# Patient Record
Sex: Female | Born: 1985 | Race: White | Hispanic: No | Smoking: Current every day smoker
Health system: Southern US, Community
[De-identification: ages and names within clinical notes are randomized; demographics above are authoritative.]

---

## 2005-11-18 ENCOUNTER — Emergency Department: Payer: Self-pay | Admitting: Emergency Medicine

## 2007-04-10 ENCOUNTER — Emergency Department: Payer: Self-pay | Admitting: Emergency Medicine

## 2007-04-12 ENCOUNTER — Ambulatory Visit: Payer: Self-pay | Admitting: Emergency Medicine

## 2007-10-20 ENCOUNTER — Observation Stay: Payer: Self-pay | Admitting: Obstetrics and Gynecology

## 2007-11-22 ENCOUNTER — Observation Stay: Payer: Self-pay

## 2008-01-22 ENCOUNTER — Emergency Department: Payer: Self-pay | Admitting: Internal Medicine

## 2008-10-06 ENCOUNTER — Ambulatory Visit: Payer: Self-pay | Admitting: Neurology

## 2008-12-15 ENCOUNTER — Observation Stay: Payer: Self-pay

## 2008-12-30 ENCOUNTER — Observation Stay: Payer: Self-pay | Admitting: Obstetrics and Gynecology

## 2009-02-05 ENCOUNTER — Inpatient Hospital Stay: Payer: Self-pay | Admitting: Obstetrics and Gynecology

## 2010-02-07 ENCOUNTER — Emergency Department: Payer: Self-pay | Admitting: Emergency Medicine

## 2010-07-06 ENCOUNTER — Encounter: Payer: Self-pay | Admitting: Obstetrics and Gynecology

## 2010-08-21 ENCOUNTER — Encounter: Payer: Self-pay | Admitting: Obstetrics and Gynecology

## 2010-10-05 ENCOUNTER — Observation Stay: Payer: Self-pay | Admitting: Obstetrics and Gynecology

## 2010-11-02 ENCOUNTER — Ambulatory Visit: Payer: Self-pay | Admitting: Obstetrics and Gynecology

## 2010-11-03 ENCOUNTER — Inpatient Hospital Stay: Payer: Self-pay | Admitting: Obstetrics and Gynecology

## 2013-12-27 ENCOUNTER — Emergency Department: Payer: Self-pay | Admitting: Emergency Medicine

## 2014-08-04 ENCOUNTER — Emergency Department: Payer: Self-pay | Admitting: Emergency Medicine

## 2014-08-11 ENCOUNTER — Ambulatory Visit: Payer: Self-pay | Admitting: Podiatry
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
## 2015-05-27 ENCOUNTER — Emergency Department
Admission: EM | Admit: 2015-05-27 | Discharge: 2015-05-28 | Disposition: A | Discharge status: Home / Self Care | Payer: Self-pay

## 2019-11-12 ENCOUNTER — Other Ambulatory Visit: Payer: Self-pay

## 2019-11-12 ENCOUNTER — Other Ambulatory Visit: Payer: Self-pay | Admitting: Emergency Medicine

## 2019-11-12 ENCOUNTER — Emergency Department: Payer: Self-pay

## 2019-11-12 ENCOUNTER — Emergency Department
Admission: EM | Admit: 2019-11-12 | Discharge: 2019-11-12 | Disposition: A | Discharge status: Home / Self Care | Payer: Self-pay | Attending: Emergency Medicine | Admitting: Emergency Medicine

## 2019-11-12 DIAGNOSIS — K8050 Calculus of bile duct without cholangitis or cholecystitis without obstruction: Secondary | ICD-10-CM | POA: Insufficient documentation

## 2019-11-12 DIAGNOSIS — R1110 Vomiting, unspecified: Secondary | ICD-10-CM | POA: Insufficient documentation

## 2019-11-12 DIAGNOSIS — R1011 Right upper quadrant pain: Secondary | ICD-10-CM | POA: Insufficient documentation

## 2019-11-12 LAB — POCT PREGNANCY, URINE: Preg Test, Ur: NEGATIVE

## 2019-11-12 LAB — POC URINE PREG, ED: Preg Test, Ur: NEGATIVE

## 2019-11-12 MED ORDER — ONDANSETRON 4 MG PO TBDP: 4.0000 mg | ORAL_TABLET | Freq: Three times a day (TID) | ORAL | 0 refills | Status: DC | PRN

## 2019-11-12 MED ORDER — OXYCODONE HCL 5 MG PO TABS: 5.0000 mg | ORAL_TABLET | Freq: Three times a day (TID) | ORAL | 0 refills | Status: AC | PRN

## 2019-11-12 NOTE — Discharge Instructions (Signed)
Call the above number and said that you are seen in the emergency room and that you need follow-up as soon as possible for a gallbladder issue.  If you develop the pain again take the oxycodone and nausea medicine to help.  However if the pain is becoming more severe than today or you develop fevers, worsening vomiting or any other concerns you should return to have your liver function retested to ensure that there is no signs of obstruction or infection.

## 2019-11-12 NOTE — ED Notes (Addendum)
This RN introduced self to pt. Pt c/o upper abdominal pain x2days that radiates to the back. Pt states pain is worse after she eats and has awaken her in the middle of the night. Pt pain accompanied by N/V. Pt states pain is 6/10 currently. Pt family at bedside.

## 2019-11-12 NOTE — ED Triage Notes (Signed)
See paper chart for computer downtime 

## 2019-11-12 NOTE — ED Notes (Addendum)
Labs were collected for this pt during downtime. Annie Main, RN contacted lab to fax results. Faxed results given to MD.

## 2019-11-12 NOTE — ED Provider Notes (Signed)
[facility] Emergency Department Provider Note  ____________________________________________   First MD Initiated Contact with Patient 11/12/19 [phone]     (approximate)  I have reviewed the triage vital signs and the nursing notes.   HISTORY  Chief Complaint Abdominal Pain    HPI Beverly Acosta is a 33 y.o. female otherwise healthy comes in with midepigastric pain x2 days that radiates into her back.  The pain has been intermittent for 2 days, worse after eating occasionally happens in the middle the night, severe, radiates into her back, better after few hours on its own.  Denies ever having this previously. Associated with episode of NBNB vomiting x1. Prior C-section and tubal ligation but no other surgeries.  Denies any other medical history.  Does smoke and drinks a lot of coffee.  Denies alcohol or NSAIDs      Medical: None Surgical: C-section Allergies Patient has no allergy information on record.  No family history on file.  Social History   Review of Systems Constitutional: No fever/chills Eyes: No visual changes. ENT: No sore throat. Cardiovascular: Denies chest pain. Respiratory: Denies shortness of breath. Gastrointestinal: + abd pain. + vomiting.  No diarrhea.  No constipation. Genitourinary: Negative for dysuria. Musculoskeletal: Negative for back pain. Skin: Negative for rash. Neurological: Negative for headaches, focal weakness or numbness. All other ROS negative ____________________________________________   PHYSICAL EXAM:  VITAL SIGNS: Blood pressure (!) 133/52, pulse 62, temperature 98.8 F (37.1 C), temperature source Oral, resp. rate 16, height 5\' 4"  (1.626 m), weight 92.5 kg, SpO2 99 %.   Constitutional: Alert and oriented. Well appearing and in no acute distress. Eyes: Conjunctivae are normal. EOMI. Head: Atraumatic. Nose: No congestion/rhinnorhea. Mouth/Throat: Mucous membranes are moist.   Neck: No stridor.  Trachea Midline. FROM Cardiovascular: Normal rate, regular rhythm. Grossly normal heart sounds.  Good peripheral circulation. Respiratory: Normal respiratory effort.  No retractions. Lungs CTAB. Gastrointestinal: Soft and nontender. No distention. No abdominal bruits.  Musculoskeletal: No lower extremity tenderness nor edema.  No joint effusions. Neurologic:  Normal speech and language. No gross focal neurologic deficits are appreciated.  Skin:  Skin is warm, dry and intact. No rash noted. Psychiatric: Mood and affect are normal. Speech and behavior are normal. GU: Deferred   ____________________________________________   LABS (all labs ordered are listed, but only abnormal results are displayed)  Labs Reviewed  CBC WITH DIFFERENTIAL/PLATELET  COMPREHENSIVE METABOLIC PANEL  LIPASE, BLOOD  URINALYSIS, ROUTINE W REFLEX MICROSCOPIC  POC URINE PREG, ED   ____________________________________________   ED ECG REPORT I, Vanessa Peck, the attending physician, personally viewed and interpreted this ECG.  EKG is sinus bradycardia rate of 54, no ST elevation, T wave inversion in lead III, normal intervals ____________________________________________  RADIOLOGY  Official radiology report(s): US ABDOMEN LIMITED RUQ  Result Date: 11/12/2019 CLINICAL DATA:  Right upper quadrant pain for 2 days EXAM: ULTRASOUND ABDOMEN LIMITED RIGHT UPPER QUADRANT COMPARISON:  None. FINDINGS: Gallbladder: Shadowing gallstones including stone fixed at the gallbladder neck. Calculi are measured at up to 8 mm. Minimal pericholecystic fluid without gallbladder wall thickening or focal tenderness. Common bile duct: Diameter: 4 mm Liver: No focal lesion identified. Within normal limits in parenchymal echogenicity. Portal vein is patent on color Doppler imaging with normal direction of blood flow towards the liver. IMPRESSION: 1. Cholelithiasis including stone fixed at the gallbladder neck. 2. Trace pericholecystic  fluid but no wall thickening or focal tenderness typical of acute cholecystitis. Electronically Signed   By: Angelica Chessman  Watts M.D.   On: 11/12/2019 05:42    ____________________________________________   PROCEDURES  Procedure(s) performed (including Critical Care):  Procedures   ____________________________________________   INITIAL IMPRESSION / ASSESSMENT AND PLAN / ED COURSE  Beverly Acosta was evaluated in Emergency Department on 11/12/2019 for the symptoms described in the history of present illness. She was evaluated in the context of the global COVID-19 pandemic, which necessitated consideration that the patient might be at risk for infection with the SARS-CoV-2 virus that causes COVID-19. Institutional protocols and algorithms that pertain to the evaluation of patients at risk for COVID-19 are in a state of rapid change based on information released by regulatory bodies including the CDC and federal and state organizations. These policies and algorithms were followed during the patient's care in the ED.    Patient is well-appearing 33 year old who presents with epigastric abdominal pain.  Will get labs to evaluate for cholecystitis, pancreatitis, ACS.  Also get ultrasound to evaluate for biliary colic versus cholecystitis.  Patient's labs are reassuring.  They were done during downtime so was given a printed fax.  Her CMP was normal except for a slightly elevated glucose of 108.  Her CBC was normal except for a white count of 11.5.  And her troponin was less than 2 and her urine had 6-10 RBCs but no WBCs and was otherwise negative.  Lipase was also negative.   Ultrasound was consistent with cholelithiasis with a stone fixed at the gallbladder neck.  There is trace.  "Cholecystic fluid but no wall thickening or focal tenderness.  I reevaluated patient and she continues to be nontender and asymptomatic.  I had a lengthy discussion with patient that she would need close follow-up with  surgery and that there is a chance that this could cause obstruction or infection.  I did message Dr. Maia Plan who is on-call to help ensure early follow-up.  Patient also given the number to call later this afternoon to get scheduled an appointment.  In the meantime I will give her a few oxycodone and nausea medicine to help with the biliary colic but we discussed avoiding fatty foods.  We also discussed not using the medicine to mask the pain because it is becoming worse then earlier today or she develops fevers, worsening vomiting that she would need to be reevaluated make sure that the stone is not causing obstruction or infection.  Patient did express understanding and felt comfortable with going home given she is completely asymptomatic at this time with normal labs.  I discussed the provisional nature of ED diagnosis, the treatment so far, the ongoing plan of care, follow up appointments and return precautions with the patient and any family or support people present. They expressed understanding and agreed with the plan, discharged home.  ____________________________________________   FINAL CLINICAL IMPRESSION(S) / ED DIAGNOSES   Final diagnoses:  RUQ pain  Biliary colic      MEDICATIONS GIVEN DURING THIS VISIT:  Medications - No data to display   ED Discharge Orders         Ordered    ondansetron (ZOFRAN ODT) 4 MG disintegrating tablet  Every 8 hours PRN     11/12/19 0620    oxyCODONE (ROXICODONE) 5 MG immediate release tablet  Every 8 hours PRN     11/12/19 0620           Note:  This document was prepared using Dragon voice recognition software and may include unintentional dictation errors.   Artis Delay  E, MD 11/12/19 [phone]

## 2019-11-12 NOTE — ED Provider Notes (Signed)
Patient was seen earlier and given a prescription for oxycodone 5 mg 3 times daily #15 as needed pain but Dr. Jari Pigg did not sign the prescription so the pharmacy would not fill it.  Patient brought the prescription back and I have given her a handwritten 1 under my name for the same drug and amount.   Nena Polio, MD 11/12/19 [phone]

## 2020-03-01 IMAGING — US US ABDOMEN LIMITED
1 series · 14 of 25 positions shown · non-contrast
Comparison: None.

CLINICAL DATA: Right upper quadrant pain for 2 days

EXAM:
ULTRASOUND ABDOMEN LIMITED RIGHT UPPER QUADRANT

[Series 1: us abdomen limited · 14 of 79 slices shown]
[im 1/79]
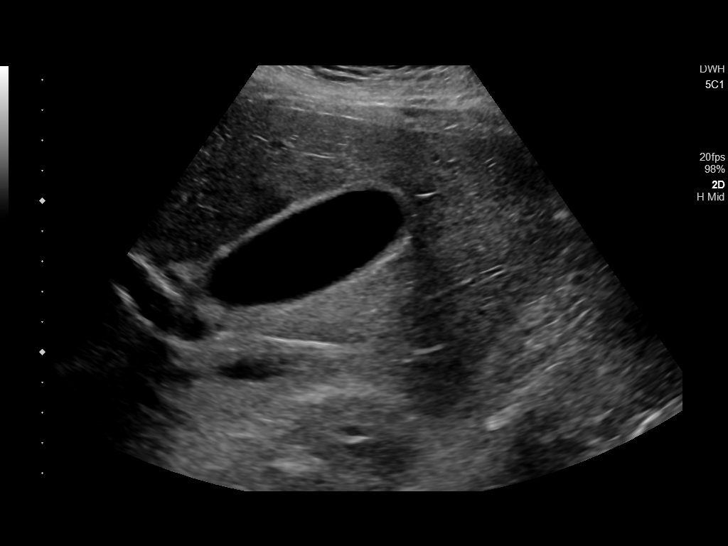
[im 7/79]
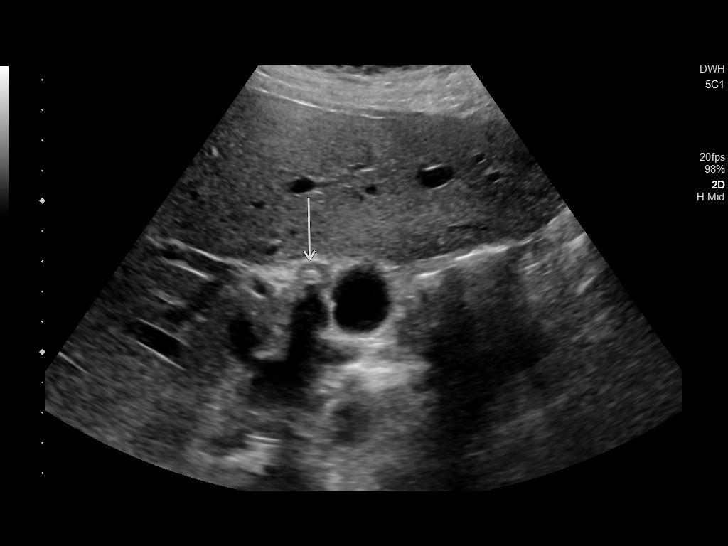
[im 14/79]
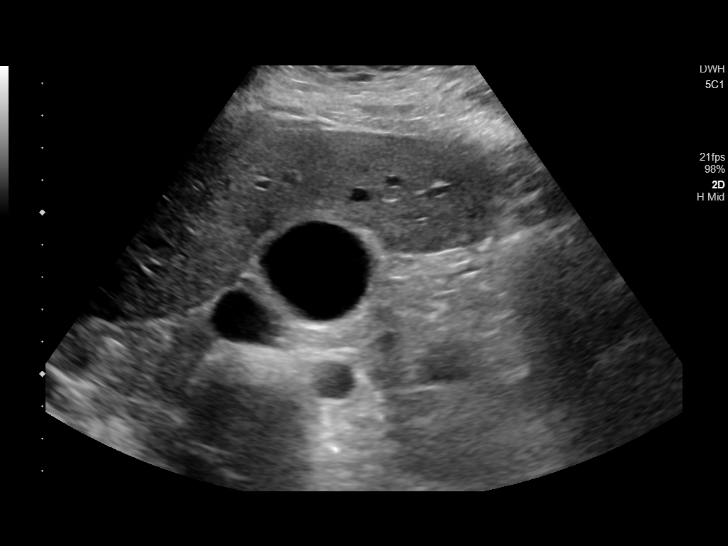
[im 20/79]
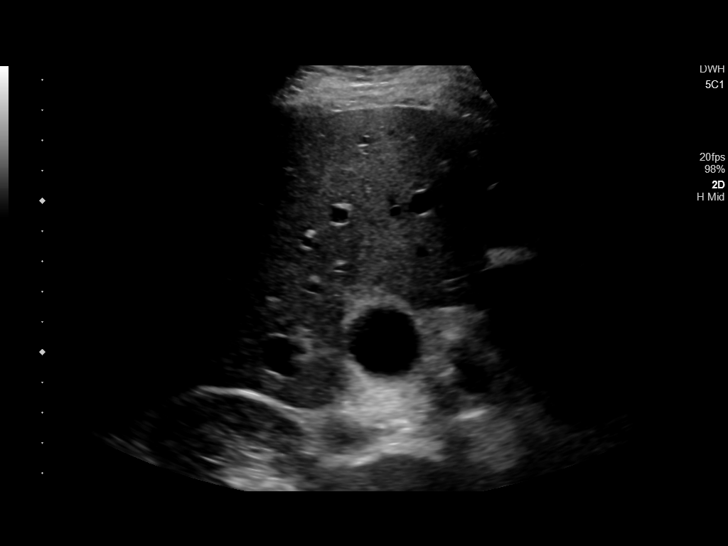
[im 27/79]
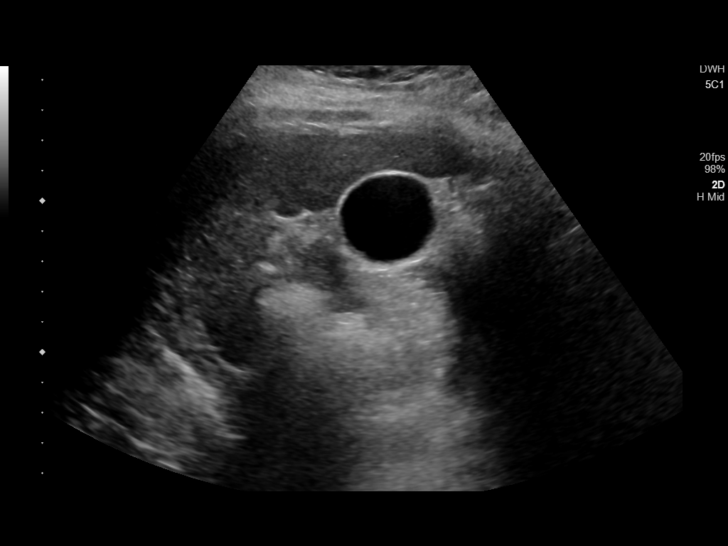
[im 30/79]
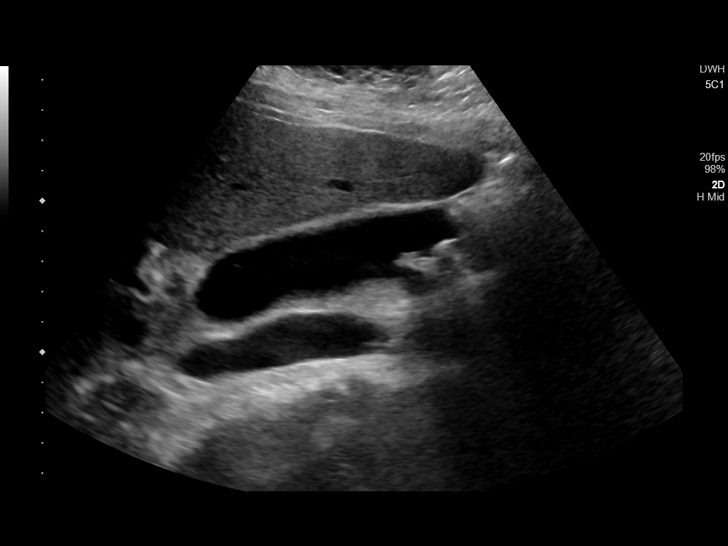
[im 36/79]
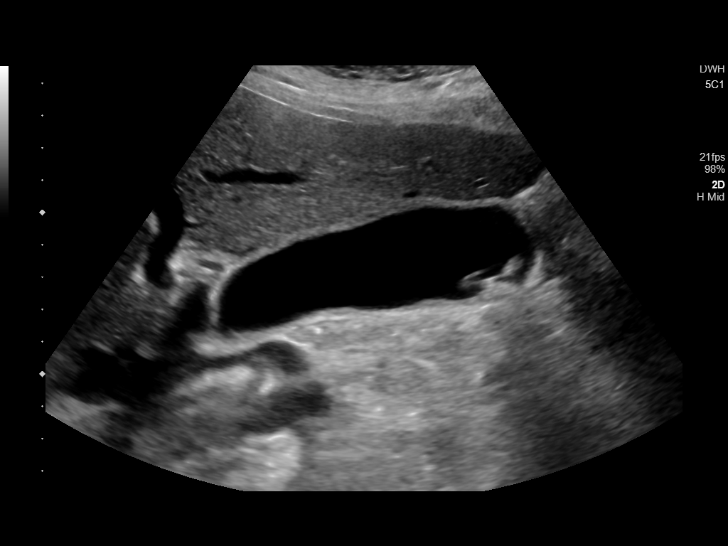
[im 43/79]
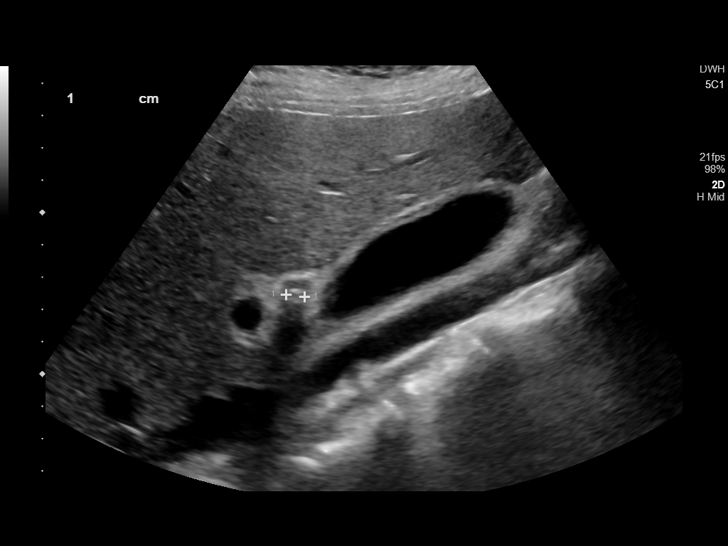
[im 49/79]
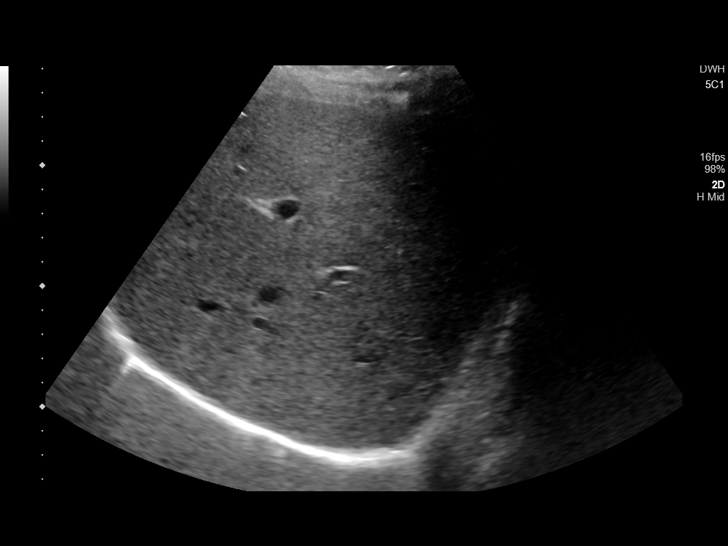
[im 53/79]
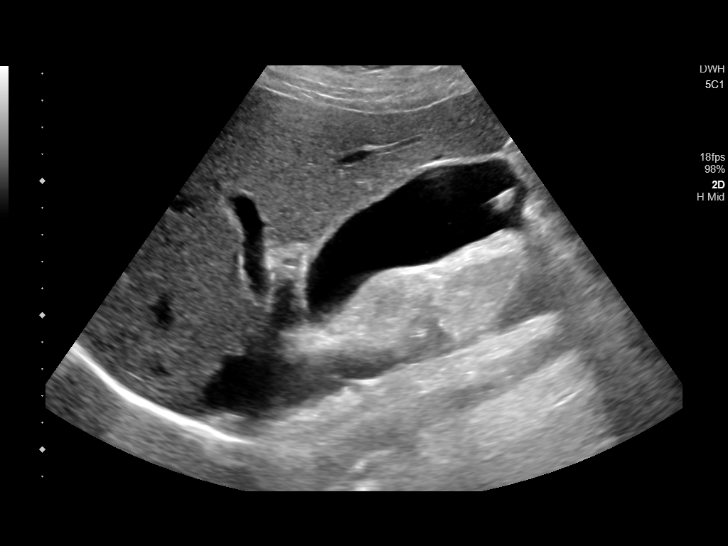
[im 59/79]
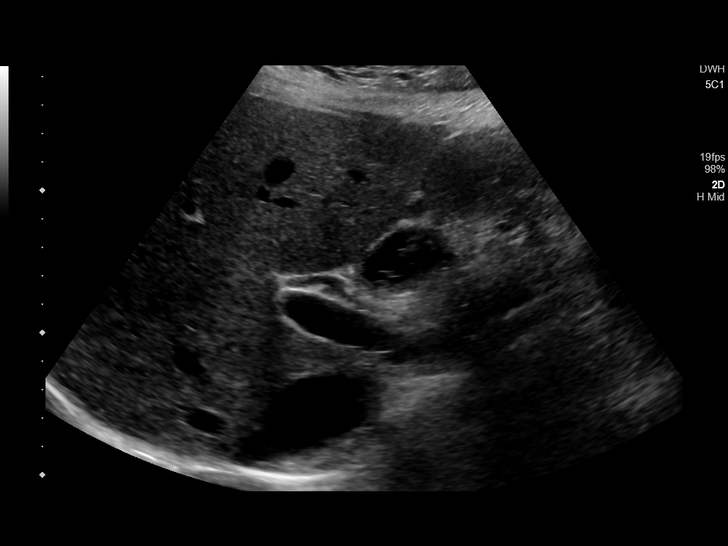
[im 66/79]
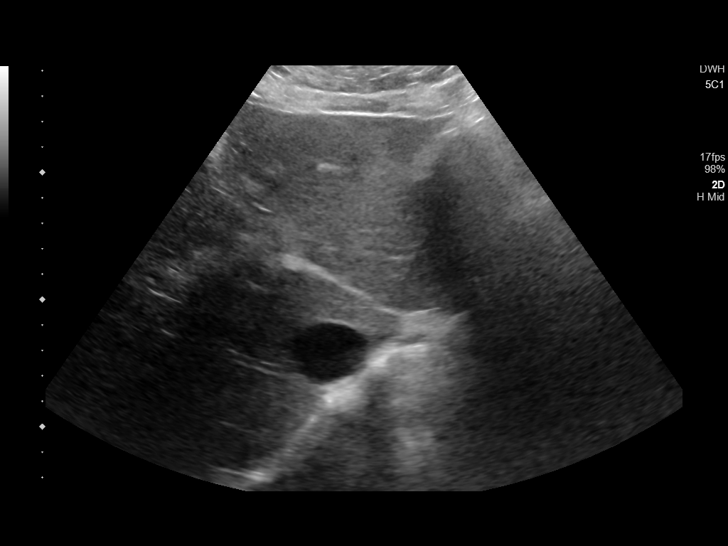
[im 72/79]
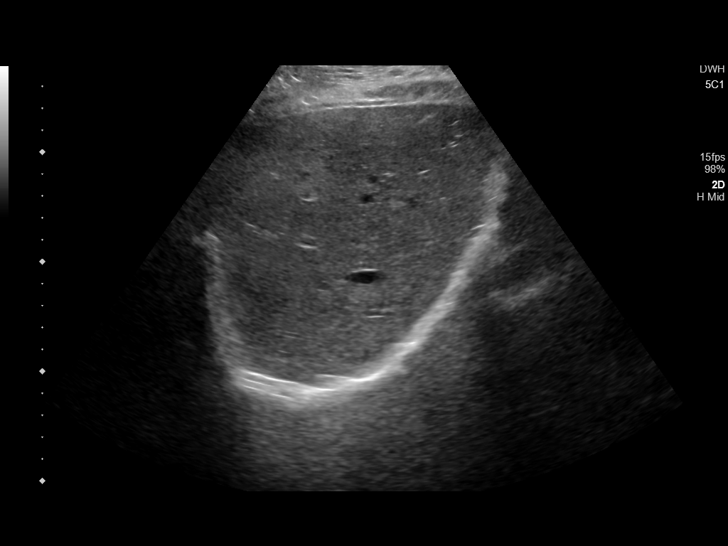
[im 79/79]
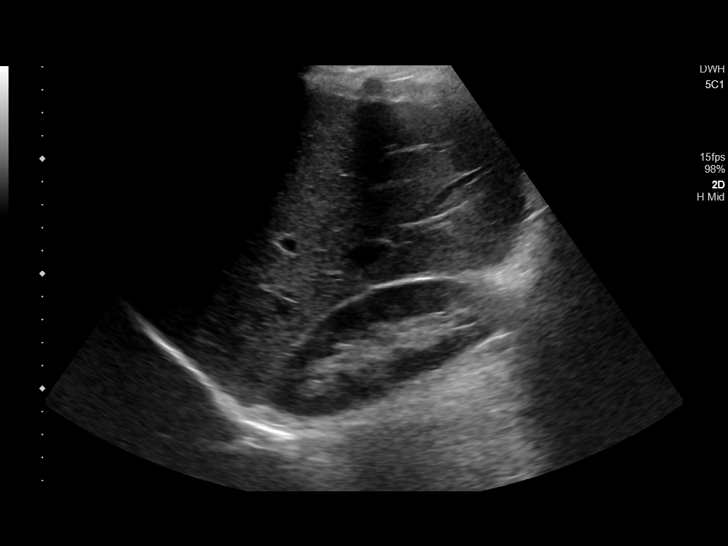

[14 of 25 positions shown; findings below may reference images not displayed]

FINDINGS: Gallbladder:

Shadowing gallstones including stone fixed at the gallbladder neck.
Calculi are measured at up to 8 mm. Minimal pericholecystic fluid
without gallbladder wall thickening or focal tenderness.

Common bile duct:

Diameter: 4 mm

Liver:

No focal lesion identified. Within normal limits in parenchymal
echogenicity. Portal vein is patent on color Doppler imaging with
normal direction of blood flow towards the liver.
IMPRESSION: 1. Cholelithiasis including stone fixed at the gallbladder neck.
2. Trace pericholecystic fluid but no wall thickening or focal
tenderness typical of acute cholecystitis.

## 2020-08-10 ENCOUNTER — Telehealth: Payer: Self-pay | Admitting: Physician Assistant

## 2020-08-10 NOTE — Telephone Encounter (Signed)
Called to discuss with patient about Covid symptoms and the use of casirivimab/imdevimab, a monoclonal antibody infusion for those with mild to moderate Covid symptoms and at a high risk of hospitalization.  Pt is qualified for this infusion at the [facility] infusion center due to; Specific high risk criteria : BMI > 25. Pt called bc her brother had the infusion.    Message left to call back our hotline [phone].  Cline Crock PA-C  [facility]

## 2023-07-30 ENCOUNTER — Other Ambulatory Visit: Payer: Self-pay

## 2023-07-30 ENCOUNTER — Emergency Department (HOSPITAL_COMMUNITY): Payer: Medicaid Other

## 2023-07-30 ENCOUNTER — Encounter (HOSPITAL_COMMUNITY): Payer: Self-pay | Admitting: *Deleted

## 2023-07-30 ENCOUNTER — Emergency Department (HOSPITAL_COMMUNITY)
Admission: EM | Admit: 2023-07-30 | Discharge: 2023-07-30 | Disposition: A | Discharge status: Home / Self Care | Payer: Medicaid Other | Attending: Emergency Medicine | Admitting: Emergency Medicine

## 2023-07-30 DIAGNOSIS — R1011 Right upper quadrant pain: Secondary | ICD-10-CM | POA: Diagnosis present

## 2023-07-30 DIAGNOSIS — K8020 Calculus of gallbladder without cholecystitis without obstruction: Secondary | ICD-10-CM | POA: Diagnosis not present

## 2023-07-30 LAB — CBC
HCT: 43.6 % (ref 36.0–46.0)
Hemoglobin: 14.7 g/dL (ref 12.0–15.0)
MCH: 30.7 pg (ref 26.0–34.0)
MCHC: 33.7 g/dL (ref 30.0–36.0)
MCV: 91 fL (ref 80.0–100.0)
Platelets: 257 10*3/uL (ref 150–400)
RBC: 4.79 MIL/uL (ref 3.87–5.11)
RDW: 12.6 % (ref 11.5–15.5)
WBC: 9.1 10*3/uL (ref 4.0–10.5)
nRBC: 0 % (ref 0.0–0.2)

## 2023-07-30 LAB — URINALYSIS, ROUTINE W REFLEX MICROSCOPIC
Bilirubin Urine: NEGATIVE
Glucose, UA: NEGATIVE mg/dL
Ketones, ur: NEGATIVE mg/dL
Leukocytes,Ua: NEGATIVE
Nitrite: NEGATIVE
Protein, ur: NEGATIVE mg/dL
Specific Gravity, Urine: 1.021 (ref 1.005–1.030)
pH: 5 (ref 5.0–8.0)

## 2023-07-30 LAB — LIPASE, BLOOD: Lipase: 29 U/L (ref 11–51)

## 2023-07-30 LAB — COMPREHENSIVE METABOLIC PANEL
ALT: 27 U/L (ref 0–44)
AST: 16 U/L (ref 15–41)
Albumin: 3.7 g/dL (ref 3.5–5.0)
Alkaline Phosphatase: 45 U/L (ref 38–126)
Anion gap: 10 (ref 5–15)
BUN: 16 mg/dL (ref 6–20)
CO2: 23 mmol/L (ref 22–32)
Calcium: 9 mg/dL (ref 8.9–10.3)
Chloride: 103 mmol/L (ref 98–111)
Creatinine, Ser: 0.89 mg/dL (ref 0.44–1.00)
GFR, Estimated: >60 mL/min (ref >60)
Glucose, Bld: 108 mg/dL — ABNORMAL HIGH (ref 70–99)
Potassium: 3.5 mmol/L (ref 3.5–5.1)
Sodium: 136 mmol/L (ref 135–145)
Total Bilirubin: 0.5 mg/dL (ref 0.3–1.2)
Total Protein: 6.5 g/dL (ref 6.5–8.1)

## 2023-07-30 LAB — HCG, SERUM, QUALITATIVE: Preg, Serum: NEGATIVE

## 2023-07-30 MED ORDER — ONDANSETRON 4 MG PO TBDP: 4.0000 mg | ORAL_TABLET | Freq: Three times a day (TID) | ORAL | 0 refills | Status: AC | PRN

## 2023-07-30 NOTE — ED Notes (Signed)
US at bedside

## 2023-07-30 NOTE — Discharge Instructions (Signed)
You have been evaluated for your symptoms.  You do have evidence of gallstone.  Please avoid eating fatty food, take Zofran as needed for nausea, call and follow-up closely with general surgery for outpatient management of your condition.  If your symptoms become progressively worse and you have any concern please return to the ED for further care.

## 2023-07-30 NOTE — ED Triage Notes (Signed)
Pain under the right breast that radiates to the back, feels like pain from her gallbladder, had issues about a year ago for the same. Vomited x 2. Pain in the central chest. No meds PTA.

## 2023-07-30 NOTE — ED Provider Notes (Signed)
[place] EMERGENCY DEPARTMENT AT [facility] Provider Note   CSN: [id number] Arrival date & time: 07/30/23  0436     History  Chief Complaint  Patient presents with   Abdominal Pain    Beverly Acosta is a 37 y.o. female.  The history is provided by the patient and medical records. No language interpreter was used.  Abdominal Pain    37 year old female presenting with complaint of abdominal pain.  Patient endorsed having pain to her right upper quadrant since yesterday afternoon.  She described pain as a cramping sensation, radiates towards her back with associated nausea and 1 episodes of nonbloody nonbilious vomiting.  This morning she felt the pain came back which concerns her.  States that the symptoms seems to be improving.  Mention that she has history of gallstones in the past and pain felt similar.  She has not had her gallbladder removed.  She ate mac & cheese, and [employer] last night.  She denies any fever or chills no chest pain or shortness of breath no productive cough no urinary symptoms no vaginal bleeding or vaginal discharge.  She denies heavy alcohol use or tobacco use.  Home Medications Prior to Admission medications   Medication Sig Start Date End Date Taking? Authorizing Provider  ondansetron (ZOFRAN ODT) 4 MG disintegrating tablet Take 1 tablet (4 mg total) by mouth every 8 (eight) hours as needed for nausea or vomiting. 11/12/19   Concha Se, MD      Allergies    Sulfamethoxazole-trimethoprim    Review of Systems   Review of Systems  Gastrointestinal:  Positive for abdominal pain.  All other systems reviewed and are negative.   Physical Exam Updated Vital Signs BP (!) 144/97 (BP Location: Left Arm)   Pulse 69   Temp 98.9 F (37.2 C) (Oral)   Resp 18   LMP 07/12/2023   SpO2 96%  Physical Exam Vitals and nursing note reviewed.  Constitutional:      General: She is not in acute distress.    Appearance: She is well-developed.   HENT:     Head: Atraumatic.  Eyes:     Conjunctiva/sclera: Conjunctivae normal.  Cardiovascular:     Rate and Rhythm: Normal rate and regular rhythm.  Pulmonary:     Effort: Pulmonary effort is normal.     Breath sounds: No wheezing, rhonchi or rales.  Abdominal:     Tenderness: There is abdominal tenderness in the right upper quadrant. There is no guarding or rebound. Negative signs include Murphy's sign, Rovsing's sign and McBurney's sign.     Hernia: No hernia is present.  Musculoskeletal:     Cervical back: Neck supple.  Skin:    Findings: No rash.  Neurological:     Mental Status: She is alert.  Psychiatric:        Mood and Affect: Mood normal.     ED Results / Procedures / Treatments   Labs (all labs ordered are listed, but only abnormal results are displayed) Labs Reviewed  COMPREHENSIVE METABOLIC PANEL - Abnormal; Notable for the following components:      Result Value   Glucose, Bld 108 (*)    All other components within normal limits  URINALYSIS, ROUTINE W REFLEX MICROSCOPIC - Abnormal; Notable for the following components:   APPearance HAZY (*)    Hgb urine dipstick SMALL (*)    Bacteria, UA FEW (*)    All other components within normal limits  LIPASE, BLOOD  CBC  HCG, SERUM, QUALITATIVE    EKG None  Radiology US Abdomen Limited  Result Date: 07/30/2023 CLINICAL DATA:  Right upper quadrant pain EXAM: ULTRASOUND ABDOMEN LIMITED RIGHT UPPER QUADRANT COMPARISON:  Ultrasound 11/12/2019 FINDINGS: Gallbladder: Gallbladder is nondilated. There is some wall thickening but nonspecific with this level distention of up to 4 mm. Multiple shadowing stones in the gallbladder. No adjacent fluid. Common bile duct: Diameter: 3 mm Liver: No focal lesion identified. Within normal limits in parenchymal echogenicity. Portal vein is patent on color Doppler imaging with normal direction of blood flow towards the liver. Other: None. IMPRESSION: Gallstones. Slight gallbladder wall  thickening but the gallbladder is nondilated. No ductal dilatation. No adjacent fluid or reported sonographic Murphy's sign. Electronically Signed   By: Karen Kays M.D.   On: 07/30/2023 12:43    Procedures Procedures    Medications Ordered in ED Medications - No data to display  ED Course/ Medical Decision Making/ A&P                                 Medical Decision Making Amount and/or Complexity of Data Reviewed Labs: ordered. Radiology: ordered.   BP 129/70   Pulse 73   Temp 98.4 F (36.9 C)   Resp 17   LMP 07/12/2023   SpO2 99%   44:31 AM  37 year old female presenting with complaint of abdominal pain.  Patient endorsed having pain to her right upper quadrant since yesterday afternoon.  She described pain as a cramping sensation, radiates towards her back with associated nausea and 1 episodes of nonbloody nonbilious vomiting.  This morning she felt the pain came back which concerns her.  States that the symptoms seems to be improving.  Mention that she has history of gallstones in the past and pain felt similar.  She has not had her gallbladder removed.  She ate mac & cheese, and [employer] last night.  She denies any fever or chills no chest pain or shortness of breath no productive cough no urinary symptoms no vaginal bleeding or vaginal discharge.  She denies heavy alcohol use or tobacco use.  On exam this is a well-appearing female resting comfortably in bed appears to be in no acute discomfort.  Heart with normal rate and rhythm, lungs clear to auscultation bilaterally abdomen is soft with some right upper quadrant tenderness but without guarding or rebound tenderness.  Negative Murphy sign, no pain McBurney's point  Vital signs overall reassuring no fever no hypoxia.  Given symptoms similar to prior gallstone, will obtain limited abdominal ultrasound to assess for any biliary disease.  -Labs ordered, independently viewed and interpreted by me.  Labs remarkable for normal  UA, labs are reassuring, normal lipase and normal liver function -The patient was maintained on a cardiac monitor.  I personally viewed and interpreted the cardiac monitored which showed an underlying rhythm of: NSR -Imaging independently viewed and interpreted by me and I agree with radiologist's interpretation.  Result remarkable for limited abd US showing gallstones with slight gallbladder wall thickening but the gallbladder is nondilated.   -This patient presents to the ED for concern of abd pain, this involves an extensive number of treatment options, and is a complaint that carries with it a high risk of complications and morbidity.  The differential diagnosis includes cholecystitis, colitis, gastritis, appendicitis, GERD, PUD -Co morbidities that complicate the patient evaluation includes none -Treatment includes none -Reevaluation of the patient  after these medicines showed that the patient improved -PCP office notes or outside notes reviewed -Escalation to admission/observation considered: patients feels much better, is comfortable with discharge, and will follow up with PCP -Prescription medication considered, patient comfortable with zofran -Social Determinant of Health considered which includes tobacco use  1:46 PM Patient overall well-appearing.  Pain is well-controlled.  At this time I felt patient is stable to be discharged home with close follow-up outpatient with [facility] surgery for her gallbladder problem.  I encouraged patient to avoid fatty food in the meantime and I gave patient's strict return precaution.  Patient was understanding and agrees with plan.         Final Clinical Impression(s) / ED Diagnoses Final diagnoses:  Calculus of gallbladder without cholecystitis without obstruction    Rx / DC Orders ED Discharge Orders          Ordered    ondansetron (ZOFRAN ODT) 4 MG disintegrating tablet  Every 8 hours PRN        07/30/23 1347               Fayrene Helper, PA-C 07/30/23 1349    Bethann Berkshire, MD 07/31/23 1137

## 2024-03-31 ENCOUNTER — Other Ambulatory Visit: Payer: Self-pay

## 2024-03-31 ENCOUNTER — Emergency Department (HOSPITAL_COMMUNITY)
Admission: EM | Admit: 2024-03-31 | Discharge: 2024-03-31 | Discharge status: Against Medical Advice | Attending: Emergency Medicine | Admitting: Emergency Medicine

## 2024-03-31 ENCOUNTER — Encounter (HOSPITAL_COMMUNITY): Payer: Self-pay

## 2024-03-31 DIAGNOSIS — Z5321 Procedure and treatment not carried out due to patient leaving prior to being seen by health care provider: Secondary | ICD-10-CM | POA: Diagnosis not present

## 2024-03-31 DIAGNOSIS — R110 Nausea: Secondary | ICD-10-CM | POA: Insufficient documentation

## 2024-03-31 DIAGNOSIS — R1013 Epigastric pain: Secondary | ICD-10-CM | POA: Insufficient documentation

## 2024-03-31 LAB — CBC
HCT: 45.6 % (ref 36.0–46.0)
Hemoglobin: 14.6 g/dL (ref 12.0–15.0)
MCH: 29.1 pg (ref 26.0–34.0)
MCHC: 32 g/dL (ref 30.0–36.0)
MCV: 91 fL (ref 80.0–100.0)
Platelets: 303 10*3/uL (ref 150–400)
RBC: 5.01 MIL/uL (ref 3.87–5.11)
RDW: 13.2 % (ref 11.5–15.5)
WBC: 9.4 10*3/uL (ref 4.0–10.5)
nRBC: 0 % (ref 0.0–0.2)

## 2024-03-31 LAB — COMPREHENSIVE METABOLIC PANEL WITH GFR
ALT: 15 U/L (ref 0–44)
AST: 13 U/L — ABNORMAL LOW (ref 15–41)
Albumin: 3.9 g/dL (ref 3.5–5.0)
Alkaline Phosphatase: 56 U/L (ref 38–126)
Anion gap: 10 (ref 5–15)
BUN: 13 mg/dL (ref 6–20)
CO2: 20 mmol/L — ABNORMAL LOW (ref 22–32)
Calcium: 8.9 mg/dL (ref 8.9–10.3)
Chloride: 107 mmol/L (ref 98–111)
Creatinine, Ser: 0.74 mg/dL (ref 0.44–1.00)
GFR, Estimated: >60 mL/min (ref >60)
Glucose, Bld: 101 mg/dL — ABNORMAL HIGH (ref 70–99)
Potassium: 4.2 mmol/L (ref 3.5–5.1)
Sodium: 137 mmol/L (ref 135–145)
Total Bilirubin: 0.6 mg/dL (ref 0.0–1.2)
Total Protein: 6.8 g/dL (ref 6.5–8.1)

## 2024-03-31 LAB — URINALYSIS, ROUTINE W REFLEX MICROSCOPIC
Bacteria, UA: NONE SEEN
Bilirubin Urine: NEGATIVE
Glucose, UA: NEGATIVE mg/dL
Ketones, ur: NEGATIVE mg/dL
Leukocytes,Ua: NEGATIVE
Nitrite: NEGATIVE
Protein, ur: NEGATIVE mg/dL
Specific Gravity, Urine: 1.02 (ref 1.005–1.030)
pH: 6 (ref 5.0–8.0)

## 2024-03-31 LAB — HCG, SERUM, QUALITATIVE: Preg, Serum: NEGATIVE

## 2024-03-31 LAB — LIPASE, BLOOD: Lipase: 27 U/L (ref 11–51)

## 2024-03-31 NOTE — ED Triage Notes (Signed)
 Patient presents to ER with epigastric pain and nausea, patient states she has had flare ups of pain like this before and thinks it is her gall bladder. Every flare up has worsening pain.

## 2024-06-04 ENCOUNTER — Encounter (HOSPITAL_COMMUNITY): Payer: Self-pay

## 2024-06-04 ENCOUNTER — Emergency Department (HOSPITAL_COMMUNITY)

## 2024-06-04 ENCOUNTER — Other Ambulatory Visit: Payer: Self-pay

## 2024-06-04 ENCOUNTER — Observation Stay (HOSPITAL_COMMUNITY)
Admission: EM | Admit: 2024-06-04 | Discharge: 2024-06-06 | Disposition: A | Discharge status: Home / Self Care | Attending: General Surgery | Admitting: General Surgery

## 2024-06-04 DIAGNOSIS — Z6841 Body Mass Index (BMI) 40.0 and over, adult: Secondary | ICD-10-CM | POA: Insufficient documentation

## 2024-06-04 DIAGNOSIS — K8012 Calculus of gallbladder with acute and chronic cholecystitis without obstruction: Secondary | ICD-10-CM | POA: Diagnosis not present

## 2024-06-04 DIAGNOSIS — R109 Unspecified abdominal pain: Secondary | ICD-10-CM | POA: Diagnosis present

## 2024-06-04 DIAGNOSIS — K8000 Calculus of gallbladder with acute cholecystitis without obstruction: Principal | ICD-10-CM

## 2024-06-04 DIAGNOSIS — F1090 Alcohol use, unspecified, uncomplicated: Secondary | ICD-10-CM | POA: Insufficient documentation

## 2024-06-04 DIAGNOSIS — K8010 Calculus of gallbladder with chronic cholecystitis without obstruction: Secondary | ICD-10-CM | POA: Diagnosis present

## 2024-06-04 DIAGNOSIS — K810 Acute cholecystitis: Secondary | ICD-10-CM | POA: Diagnosis present

## 2024-06-04 LAB — MAGNESIUM: Magnesium: 2 mg/dL (ref 1.7–2.4)

## 2024-06-04 LAB — COMPREHENSIVE METABOLIC PANEL WITH GFR
ALT: 14 U/L (ref 0–44)
AST: 16 U/L (ref 15–41)
Albumin: 4.2 g/dL (ref 3.5–5.0)
Alkaline Phosphatase: 58 U/L (ref 38–126)
Anion gap: 10 (ref 5–15)
BUN: 13 mg/dL (ref 6–20)
CO2: 23 mmol/L (ref 22–32)
Calcium: 9.8 mg/dL (ref 8.9–10.3)
Chloride: 104 mmol/L (ref 98–111)
Creatinine, Ser: 0.8 mg/dL (ref 0.44–1.00)
GFR, Estimated: >60 mL/min (ref >60)
Glucose, Bld: 107 mg/dL — ABNORMAL HIGH (ref 70–99)
Potassium: 3.6 mmol/L (ref 3.5–5.1)
Sodium: 137 mmol/L (ref 135–145)
Total Bilirubin: 0.8 mg/dL (ref 0.0–1.2)
Total Protein: 7.5 g/dL (ref 6.5–8.1)

## 2024-06-04 LAB — CBC
HCT: 46.9 % — ABNORMAL HIGH (ref 36.0–46.0)
Hemoglobin: 15.1 g/dL — ABNORMAL HIGH (ref 12.0–15.0)
MCH: 29.5 pg (ref 26.0–34.0)
MCHC: 32.2 g/dL (ref 30.0–36.0)
MCV: 91.6 fL (ref 80.0–100.0)
Platelets: 299 10*3/uL (ref 150–400)
RBC: 5.12 MIL/uL — ABNORMAL HIGH (ref 3.87–5.11)
RDW: 13 % (ref 11.5–15.5)
WBC: 15.9 10*3/uL — ABNORMAL HIGH (ref 4.0–10.5)
nRBC: 0 % (ref 0.0–0.2)

## 2024-06-04 LAB — HCG, SERUM, QUALITATIVE: Preg, Serum: NEGATIVE

## 2024-06-04 LAB — LIPASE, BLOOD: Lipase: 28 U/L (ref 11–51)

## 2024-06-04 LAB — TROPONIN I (HIGH SENSITIVITY)
Troponin I (High Sensitivity): <2 ng/L (ref <18)
Troponin I (High Sensitivity): <2 ng/L (ref <18)

## 2024-06-04 MED ORDER — PANTOPRAZOLE SODIUM 40 MG IV SOLR
40.0000 mg | Freq: Once | INTRAVENOUS | Status: AC
  Administered 2024-06-04: 40 mg via INTRAVENOUS
  Filled 2024-06-04: qty 10

## 2024-06-04 MED ORDER — ONDANSETRON HCL 4 MG/2ML IJ SOLN
4.0000 mg | Freq: Once | INTRAMUSCULAR | Status: AC
Start: 2024-06-04 — End: 2024-06-04
  Administered 2024-06-04: 4 mg via INTRAVENOUS
  Filled 2024-06-04: qty 2

## 2024-06-04 MED ORDER — IOHEXOL 300 MG/ML  SOLN
100.0000 mL | Freq: Once | INTRAMUSCULAR | Status: AC | PRN
  Administered 2024-06-04: 100 mL via INTRAVENOUS

## 2024-06-04 MED ORDER — FENTANYL CITRATE PF 50 MCG/ML IJ SOSY
50.0000 ug | PREFILLED_SYRINGE | Freq: Once | INTRAMUSCULAR | Status: AC
  Administered 2024-06-04: 50 ug via INTRAVENOUS
  Filled 2024-06-04: qty 1

## 2024-06-04 MED ORDER — SODIUM CHLORIDE 0.9 % IV BOLUS
1000.0000 mL | Freq: Once | INTRAVENOUS | Status: AC
  Administered 2024-06-04: 1000 mL via INTRAVENOUS

## 2024-06-04 NOTE — ED Notes (Signed)
 Patient transported to CT

## 2024-06-04 NOTE — ED Provider Notes (Signed)
 [facility] Provider Note   CSN: [id number] Arrival date & time: 06/04/24  1818     Patient presents with: Acosta Pain and Chest Pain   Beverly Acosta is a 38 y.o. female patient who presents to the emergency department today for further evaluation of right upper quadrant and epigastric Acosta pain that started around 11 AM this morning.  Patient states that she has had gallbladder issues in the past really over the last 5 years.  She was seen by general surgery in the past and they  ultimately stated they will hold off on surgery.  She states today pain started around 11 AM and has been constant since onset.  She describes some epigastric burning that radiates into the central chest.  Also has pain that radiates to the back as well.  She has been having intractable nausea and vomiting.  Denies any fever, chills, cough, shortness of breath, diarrhea.    Acosta Pain Associated symptoms: chest pain   Chest Pain Associated symptoms: Acosta pain        Prior to Admission medications   Medication Sig Start Date End Date Taking? Authorizing Provider  ondansetron  (ZOFRAN  ODT) 4 MG disintegrating tablet Take 1 tablet (4 mg total) by mouth every 8 (eight) hours as needed for nausea or vomiting. 07/30/23   Nivia Colon, PA-C    Allergies: Sulfamethoxazole-trimethoprim    Review of Systems  Cardiovascular:  Positive for chest pain.  Gastrointestinal:  Positive for Acosta pain.  All other systems reviewed and are negative.   Updated Vital Signs BP (!) 149/120   Pulse 60   Temp 98.1 F (36.7 C) (Oral)   Resp 15   Ht 5' 4 (1.626 m)   Wt 108.9 kg   SpO2 100%   BMI 41.21 kg/m   Physical Exam Vitals and nursing note reviewed.  Constitutional:      General: She is not in acute distress.    Appearance: Normal appearance.  HENT:     Head: Normocephalic and atraumatic.  Eyes:     General:        Right eye: No discharge.         Left eye: No discharge.  Cardiovascular:     Comments: Regular rate and rhythm.  S1/S2 are distinct without any evidence of murmur, rubs, or gallops.  Radial pulses are 2+ bilaterally.  Dorsalis pedis pulses are 2+ bilaterally.  No evidence of pedal edema. Pulmonary:     Comments: Clear to auscultation bilaterally.  Normal effort.  No respiratory distress.  No evidence of wheezes, rales, or rhonchi heard throughout. Acosta:     General: Abdomen is flat. Bowel sounds are normal. There is no distension.     Tenderness: There is guarding. There is no rebound.     Comments: Moderate right upper quadrant epigastric tenderness with minimal palpation.  Guarding present.  Musculoskeletal:        General: Normal range of motion.     Cervical back: Neck supple.  Skin:    General: Skin is warm and dry.     Findings: No rash.  Neurological:     General: No focal deficit present.     Mental Status: She is alert.  Psychiatric:        Mood and Affect: Mood normal.        Behavior: Behavior normal.     (all labs ordered are listed, but only abnormal results are displayed) Labs Reviewed  CBC - Abnormal; Notable for the following components:      Result Value   WBC 15.9 (*)    RBC 5.12 (*)    Hemoglobin 15.1 (*)    HCT 46.9 (*)    All other components within normal limits  COMPREHENSIVE METABOLIC PANEL WITH GFR - Abnormal; Notable for the following components:   Glucose, Bld 107 (*)    All other components within normal limits  LIPASE, BLOOD  MAGNESIUM  HCG, SERUM, QUALITATIVE  URINALYSIS, ROUTINE W REFLEX MICROSCOPIC  TROPONIN I (HIGH SENSITIVITY)  TROPONIN I (HIGH SENSITIVITY)    EKG: None  Radiology: DG Chest 2 View Result Date: 06/04/2024 CLINICAL DATA:  Chest pain and ongoing issues with bladder and right upper quadrant pain EXAM: CHEST - 2 VIEW COMPARISON:  08/04/2014 FINDINGS: The heart size and mediastinal contours are within normal limits. Both lungs are clear. The  visualized skeletal structures are unremarkable. IMPRESSION: No active cardiopulmonary disease. Electronically Signed   By: Norman Gatlin M.D.   On: 06/04/2024 19:40     Procedures   Medications Ordered in the ED  fentaNYL (SUBLIMAZE) injection 50 mcg (50 mcg Intravenous Given 06/04/24 1945)  ondansetron  (ZOFRAN ) injection 4 mg (4 mg Intravenous Given 06/04/24 1943)  sodium chloride 0.9 % bolus 1,000 mL (1,000 mLs Intravenous New Bag/Given 06/04/24 1947)  pantoprazole (PROTONIX) injection 40 mg (40 mg Intravenous Given 06/04/24 1950)    Clinical Course as of 06/04/24 2156  Thu Jun 04, 2024  2035 CBC(!) There is evidence of leukocytosis. [CF]  2044 Troponin I (High Sensitivity) Initial troponin is normal.  [CF]  2049 hCG, serum, qualitative Negative. [CF]  2059 Magnesium Normal.  [CF]  2111 Comprehensive metabolic panel(!) Negative.  [CF]  2111 Lipase, blood Negative.  [CF]    Clinical Course User Index [CF] Theotis Cameron HERO, PA-C    Medical Decision Making Beverly Acosta is a 38 y.o. female patient presents the emergency department today for further evaluation of right upper quadrant epigastric Acosta pain.  Given the patient does have gallbladder issues I am suspicious for cholecystitis.  Other considerations include ACS, reflux, pancreatitis.  Will go ahead and get Acosta labs, CT scan with contrast to further assess, troponin, EKG.  Will give her some pain medication, fluids, antiemetics, and proton pump inhibitor.  Patient does have evidence of leukocytosis.  CT scan is still pending.  Due to shift change, the rest of her care be transferred to oncoming provider for ultimate decision will be made.  Suspicious for cholecystitis.   Amount and/or Complexity of Data Reviewed Labs: ordered. Decision-making details documented in ED Course. Radiology: ordered.  Risk Prescription drug management.     Final diagnoses:  None    ED Discharge Orders     None           [name] 06/04/24 2156    Patsey Lot, MD 06/04/24 [phone]

## 2024-06-04 NOTE — ED Provider Notes (Signed)
  Physical Exam  BP (!) 147/81   Pulse 61   Temp 98 F (36.7 C)   Resp 20   Ht 5' 4 (1.626 m)   Wt 108.9 kg   SpO2 100%   BMI 41.21 kg/m   Physical Exam  Procedures  Procedures  ED Course / MDM   Clinical Course as of 06/05/24 0027  Thu Jun 04, 2024  2035 CBC(!) There is evidence of leukocytosis. [CF]  2044 Troponin I (High Sensitivity) Initial troponin is normal.  [CF]  2049 hCG, serum, qualitative Negative. [CF]  2059 Magnesium Normal.  [CF]  2111 Comprehensive metabolic panel(!) Negative.  [CF]  2111 Lipase, blood Negative.  [CF]  Fri Jun 05, 2024  0021 Consult to Dr. Sheldon, general surgeon, who is agreeable to admitting this patient to his service and adding her to the OR board for tomorrow. I appreciate his collaboration in the care of this patient.  [RS]    Clinical Course User Index [CF] Theotis Cameron HERO, PA-C [RS] Bobette Pleasant SAUNDERS, PA-C   Medical Decision Making Amount and/or Complexity of Data Reviewed Labs: ordered. Decision-making details documented in ED Course. Radiology: ordered.  Risk Prescription drug management. Decision regarding hospitalization.    Care of this patient assumed from preceding provider Signe Theotis, PA-C at time of shift change.  Please see his associated note for further insight of the patient's equal.  In brief patient is a 38 year old female with extensive history of over 5 years of intermittent gallbladder attacks who presents today with concern for worsening right upper quadrant pain and epigastric pain that started around 11:00 this morning.  Associated nausea and vomiting but no fevers.  Patient has a leukoCytosis of 15.9, normal hepatic and renal function.  At time of shift change patient is pending CT.  CT consistent with acute cholecystitis, consult general surgery as above.  Patient to be admitted to his service.  Shatisha voiced understanding of her medical evaluation and treatment plan. Each of their  questions answered to their expressed satisfaction.  She is amenable to plan for admission at this time.   This chart was dictated using voice recognition software, Dragon. Despite the best efforts of this provider to proofread and correct errors, errors may still occur which can change documentation meaning.       Asa Baudoin R, PA-C 06/05/24 9972    Patsey Lot, MD 06/05/24 [phone]

## 2024-06-04 NOTE — ED Notes (Signed)
Attempted IV access without success.

## 2024-06-04 NOTE — ED Triage Notes (Signed)
 Pt reports ongoing issues with gallbladder. Pt states that she has substernal CP and RUQ pain that has gotten more severe today.

## 2024-06-05 ENCOUNTER — Encounter (HOSPITAL_COMMUNITY)
Admission: EM | Disposition: A | Discharge status: Home / Self Care | Payer: Self-pay | Source: Home / Self Care | Attending: Emergency Medicine

## 2024-06-05 ENCOUNTER — Inpatient Hospital Stay (HOSPITAL_COMMUNITY)

## 2024-06-05 ENCOUNTER — Inpatient Hospital Stay (HOSPITAL_COMMUNITY): Admitting: Anesthesiology

## 2024-06-05 ENCOUNTER — Other Ambulatory Visit: Payer: Self-pay

## 2024-06-05 DIAGNOSIS — K819 Cholecystitis, unspecified: Secondary | ICD-10-CM

## 2024-06-05 DIAGNOSIS — K8010 Calculus of gallbladder with chronic cholecystitis without obstruction: Secondary | ICD-10-CM | POA: Diagnosis present

## 2024-06-05 DIAGNOSIS — K810 Acute cholecystitis: Secondary | ICD-10-CM | POA: Diagnosis present

## 2024-06-05 DIAGNOSIS — Z6841 Body Mass Index (BMI) 40.0 and over, adult: Secondary | ICD-10-CM

## 2024-06-05 HISTORY — PX: CHOLECYSTECTOMY: SHX55

## 2024-06-05 LAB — URINALYSIS, ROUTINE W REFLEX MICROSCOPIC
Bacteria, UA: NONE SEEN
Bilirubin Urine: NEGATIVE
Glucose, UA: NEGATIVE mg/dL
Ketones, ur: NEGATIVE mg/dL
Leukocytes,Ua: NEGATIVE
Nitrite: NEGATIVE
Protein, ur: NEGATIVE mg/dL
Specific Gravity, Urine: >1.046 — ABNORMAL HIGH (ref 1.005–1.030)
pH: 6 (ref 5.0–8.0)

## 2024-06-05 LAB — HIV ANTIBODY (ROUTINE TESTING W REFLEX): HIV Screen 4th Generation wRfx: NONREACTIVE

## 2024-06-05 SURGERY — LAPAROSCOPIC CHOLECYSTECTOMY WITH INTRAOPERATIVE CHOLANGIOGRAM
Anesthesia: General | Site: Abdomen

## 2024-06-05 MED ORDER — METHOCARBAMOL 1000 MG/10ML IJ SOLN: 1000.0000 mg | Freq: Four times a day (QID) | INTRAMUSCULAR | Status: DC | PRN

## 2024-06-05 MED ORDER — ONDANSETRON 4 MG PO TBDP
4.0000 mg | ORAL_TABLET | Freq: Four times a day (QID) | ORAL | Status: DC | PRN
Start: 2024-06-05 — End: 2024-06-06

## 2024-06-05 MED ORDER — CELECOXIB 200 MG PO CAPS
200.0000 mg | ORAL_CAPSULE | ORAL | Status: AC
  Administered 2024-06-05: 200 mg via ORAL

## 2024-06-05 MED ORDER — CELECOXIB 200 MG PO CAPS
ORAL_CAPSULE | ORAL | Status: AC
Start: 2024-06-05 — End: 2024-06-05
  Filled 2024-06-05: qty 1

## 2024-06-05 MED ORDER — HYDROMORPHONE HCL 1 MG/ML IJ SOLN
INTRAMUSCULAR | Status: AC
  Filled 2024-06-05: qty 1

## 2024-06-05 MED ORDER — FENTANYL CITRATE (PF) 100 MCG/2ML IJ SOLN
INTRAMUSCULAR | Status: DC | PRN
  Administered 2024-06-05: 50 ug via INTRAVENOUS
  Administered 2024-06-05: 100 ug via INTRAVENOUS
  Administered 2024-06-05: 50 ug via INTRAVENOUS

## 2024-06-05 MED ORDER — BUPIVACAINE-EPINEPHRINE (PF) 0.25% -1:200000 IJ SOLN
INTRAMUSCULAR | Status: AC
  Filled 2024-06-05: qty 30

## 2024-06-05 MED ORDER — OXYCODONE HCL 5 MG/5ML PO SOLN: 5.0000 mg | Freq: Once | ORAL | Status: DC | PRN

## 2024-06-05 MED ORDER — LACTATED RINGERS IV SOLN
INTRAVENOUS | Status: AC | PRN
  Administered 2024-06-05: 1000 mL

## 2024-06-05 MED ORDER — CHLORHEXIDINE GLUCONATE 0.12 % MT SOLN
15.0000 mL | Freq: Once | OROMUCOSAL | Status: AC
  Administered 2024-06-05: 15 mL via OROMUCOSAL

## 2024-06-05 MED ORDER — FENTANYL CITRATE (PF) 100 MCG/2ML IJ SOLN
INTRAMUSCULAR | Status: AC
  Filled 2024-06-05: qty 2

## 2024-06-05 MED ORDER — ONDANSETRON HCL 4 MG/2ML IJ SOLN: 4.0000 mg | Freq: Four times a day (QID) | INTRAMUSCULAR | Status: DC | PRN

## 2024-06-05 MED ORDER — PHENOL 1.4 % MT LIQD: 2.0000 | OROMUCOSAL | Status: DC | PRN

## 2024-06-05 MED ORDER — ROCURONIUM BROMIDE 10 MG/ML (PF) SYRINGE
PREFILLED_SYRINGE | INTRAVENOUS | Status: AC
Start: 2024-06-05 — End: 2024-06-05
  Filled 2024-06-05: qty 10

## 2024-06-05 MED ORDER — MORPHINE SULFATE (PF) 2 MG/ML IV SOLN: 1.0000 mg | INTRAVENOUS | Status: DC | PRN

## 2024-06-05 MED ORDER — GABAPENTIN 300 MG PO CAPS
ORAL_CAPSULE | ORAL | Status: AC
Start: 2024-06-05 — End: 2024-06-05
  Filled 2024-06-05: qty 1

## 2024-06-05 MED ORDER — PIPERACILLIN-TAZOBACTAM 3.375 G IVPB 30 MIN
3.3750 g | Freq: Once | INTRAVENOUS | Status: AC
Start: 2024-06-05 — End: 2024-06-05
  Administered 2024-06-05: 3.375 g via INTRAVENOUS
  Filled 2024-06-05: qty 50

## 2024-06-05 MED ORDER — METHOCARBAMOL 500 MG PO TABS: 1000.0000 mg | ORAL_TABLET | Freq: Four times a day (QID) | ORAL | Status: DC | PRN

## 2024-06-05 MED ORDER — MIDAZOLAM HCL 5 MG/5ML IJ SOLN
INTRAMUSCULAR | Status: DC | PRN
Start: 2024-06-05 — End: 2024-06-05
  Administered 2024-06-05: 2 mg via INTRAVENOUS

## 2024-06-05 MED ORDER — DEXAMETHASONE SODIUM PHOSPHATE 10 MG/ML IJ SOLN
INTRAMUSCULAR | Status: DC | PRN
  Administered 2024-06-05: 10 mg via INTRAVENOUS

## 2024-06-05 MED ORDER — ONDANSETRON HCL 4 MG/2ML IJ SOLN: 4.0000 mg | Freq: Once | INTRAMUSCULAR | Status: DC | PRN

## 2024-06-05 MED ORDER — CHLORHEXIDINE GLUCONATE CLOTH 2 % EX PADS: 6.0000 | MEDICATED_PAD | Freq: Once | CUTANEOUS | Status: DC

## 2024-06-05 MED ORDER — DEXAMETHASONE SODIUM PHOSPHATE 10 MG/ML IJ SOLN
INTRAMUSCULAR | Status: AC
Start: 2024-06-05 — End: 2024-06-05
  Filled 2024-06-05: qty 1

## 2024-06-05 MED ORDER — ENSURE PRE-SURGERY PO LIQD
296.0000 mL | Freq: Once | ORAL | Status: DC
Start: 2024-06-06 — End: 2024-06-05

## 2024-06-05 MED ORDER — KETOROLAC TROMETHAMINE 15 MG/ML IJ SOLN: 15.0000 mg | Freq: Four times a day (QID) | INTRAMUSCULAR | Status: DC | PRN

## 2024-06-05 MED ORDER — SCOPOLAMINE 1 MG/3DAYS TD PT72
MEDICATED_PATCH | TRANSDERMAL | Status: AC
  Filled 2024-06-05: qty 1

## 2024-06-05 MED ORDER — BUPIVACAINE-EPINEPHRINE 0.25% -1:200000 IJ SOLN
INTRAMUSCULAR | Status: DC | PRN
Start: 2024-06-05 — End: 2024-06-05
  Administered 2024-06-05: 30 mL

## 2024-06-05 MED ORDER — LACTATED RINGERS IV SOLN: INTRAVENOUS | Status: DC

## 2024-06-05 MED ORDER — METOPROLOL TARTRATE 5 MG/5ML IV SOLN: 5.0000 mg | Freq: Four times a day (QID) | INTRAVENOUS | Status: DC | PRN

## 2024-06-05 MED ORDER — SCOPOLAMINE 1 MG/3DAYS TD PT72
1.0000 | MEDICATED_PATCH | TRANSDERMAL | Status: DC
  Administered 2024-06-05: 1.5 mg via TRANSDERMAL

## 2024-06-05 MED ORDER — OXYCODONE HCL 5 MG PO TABS
5.0000 mg | ORAL_TABLET | ORAL | Status: DC | PRN
  Administered 2024-06-06: 5 mg via ORAL
  Filled 2024-06-05: qty 1

## 2024-06-05 MED ORDER — LACTATED RINGERS IV BOLUS
1000.0000 mL | Freq: Once | INTRAVENOUS | Status: AC
  Administered 2024-06-05: 1000 mL via INTRAVENOUS

## 2024-06-05 MED ORDER — MAGIC MOUTHWASH: 15.0000 mL | Freq: Four times a day (QID) | ORAL | Status: DC | PRN

## 2024-06-05 MED ORDER — ALUM & MAG HYDROXIDE-SIMETH 200-200-20 MG/5ML PO SUSP: 30.0000 mL | Freq: Four times a day (QID) | ORAL | Status: DC | PRN

## 2024-06-05 MED ORDER — DROPERIDOL 2.5 MG/ML IJ SOLN: 0.6250 mg | Freq: Once | INTRAMUSCULAR | Status: DC | PRN

## 2024-06-05 MED ORDER — SUGAMMADEX SODIUM 200 MG/2ML IV SOLN
INTRAVENOUS | Status: DC | PRN
  Administered 2024-06-05: 50 mg via INTRAVENOUS
  Administered 2024-06-05: 200 mg via INTRAVENOUS

## 2024-06-05 MED ORDER — ONDANSETRON HCL 4 MG/2ML IJ SOLN
INTRAMUSCULAR | Status: AC
Start: 2024-06-05 — End: 2024-06-05
  Filled 2024-06-05: qty 2

## 2024-06-05 MED ORDER — ACETAMINOPHEN 500 MG PO TABS
ORAL_TABLET | ORAL | Status: AC
  Filled 2024-06-05: qty 2

## 2024-06-05 MED ORDER — LIDOCAINE HCL (CARDIAC) PF 100 MG/5ML IV SOSY
PREFILLED_SYRINGE | INTRAVENOUS | Status: DC | PRN
  Administered 2024-06-05: 100 mg via INTRAVENOUS

## 2024-06-05 MED ORDER — PROPOFOL 10 MG/ML IV BOLUS
INTRAVENOUS | Status: DC | PRN
  Administered 2024-06-05: 200 mg via INTRAVENOUS
  Administered 2024-06-05: 50 ug/kg/min via INTRAVENOUS

## 2024-06-05 MED ORDER — PHENYLEPHRINE 80 MCG/ML (10ML) SYRINGE FOR IV PUSH (FOR BLOOD PRESSURE SUPPORT)
PREFILLED_SYRINGE | INTRAVENOUS | Status: DC | PRN
Start: 2024-06-05 — End: 2024-06-05
  Administered 2024-06-05: 120 ug via INTRAVENOUS

## 2024-06-05 MED ORDER — NAPHAZOLINE-GLYCERIN 0.012-0.25 % OP SOLN: 1.0000 [drp] | Freq: Four times a day (QID) | OPHTHALMIC | Status: DC | PRN

## 2024-06-05 MED ORDER — PROCHLORPERAZINE EDISYLATE 10 MG/2ML IJ SOLN: 5.0000 mg | INTRAMUSCULAR | Status: DC | PRN

## 2024-06-05 MED ORDER — OXYCODONE HCL 5 MG PO TABS: 5.0000 mg | ORAL_TABLET | Freq: Once | ORAL | Status: DC | PRN

## 2024-06-05 MED ORDER — ORAL CARE MOUTH RINSE: 15.0000 mL | Freq: Once | OROMUCOSAL | Status: AC

## 2024-06-05 MED ORDER — ACETAMINOPHEN 500 MG PO TABS
1000.0000 mg | ORAL_TABLET | ORAL | Status: AC
  Administered 2024-06-05: 1000 mg via ORAL

## 2024-06-05 MED ORDER — SIMETHICONE 40 MG/0.6ML PO SUSP: 80.0000 mg | Freq: Four times a day (QID) | ORAL | Status: DC | PRN

## 2024-06-05 MED ORDER — SUCCINYLCHOLINE CHLORIDE 200 MG/10ML IV SOSY
PREFILLED_SYRINGE | INTRAVENOUS | Status: DC | PRN
Start: 2024-06-05 — End: 2024-06-05
  Administered 2024-06-05: 140 mg via INTRAVENOUS

## 2024-06-05 MED ORDER — SODIUM CHLORIDE 0.9 % IV SOLN
2.0000 g | INTRAVENOUS | Status: DC
  Administered 2024-06-05: 2 g via INTRAVENOUS
  Filled 2024-06-05: qty 20

## 2024-06-05 MED ORDER — MIDAZOLAM HCL 2 MG/2ML IJ SOLN
INTRAMUSCULAR | Status: AC
  Filled 2024-06-05: qty 2

## 2024-06-05 MED ORDER — SODIUM CHLORIDE 0.9 % IV SOLN: INTRAVENOUS | Status: DC

## 2024-06-05 MED ORDER — DIPHENHYDRAMINE HCL 50 MG/ML IJ SOLN: 12.5000 mg | Freq: Four times a day (QID) | INTRAMUSCULAR | Status: DC | PRN

## 2024-06-05 MED ORDER — PIPERACILLIN-TAZOBACTAM 3.375 G IVPB
3.3750 g | Freq: Three times a day (TID) | INTRAVENOUS | Status: DC
  Administered 2024-06-05 – 2024-06-06 (×3): 3.375 g via INTRAVENOUS
  Filled 2024-06-05 (×3): qty 50

## 2024-06-05 MED ORDER — CHLORHEXIDINE GLUCONATE CLOTH 2 % EX PADS
6.0000 | MEDICATED_PAD | Freq: Once | CUTANEOUS | Status: AC
Start: 2024-06-05 — End: 2024-06-05
  Administered 2024-06-05: 6 via TOPICAL

## 2024-06-05 MED ORDER — ONDANSETRON HCL 4 MG/2ML IJ SOLN
INTRAMUSCULAR | Status: DC | PRN
Start: 2024-06-05 — End: 2024-06-05
  Administered 2024-06-05: 4 mg via INTRAVENOUS

## 2024-06-05 MED ORDER — ROCURONIUM BROMIDE 100 MG/10ML IV SOLN
INTRAVENOUS | Status: DC | PRN
Start: 2024-06-05 — End: 2024-06-05
  Administered 2024-06-05: 5 mg via INTRAVENOUS
  Administered 2024-06-05: 35 mg via INTRAVENOUS

## 2024-06-05 MED ORDER — HYDROMORPHONE HCL 1 MG/ML IJ SOLN: 0.5000 mg | INTRAMUSCULAR | Status: DC | PRN

## 2024-06-05 MED ORDER — ACETAMINOPHEN 325 MG PO TABS: 325.0000 mg | ORAL_TABLET | Freq: Four times a day (QID) | ORAL | Status: DC | PRN

## 2024-06-05 MED ORDER — SALINE SPRAY 0.65 % NA SOLN: 1.0000 | Freq: Four times a day (QID) | NASAL | Status: DC | PRN

## 2024-06-05 MED ORDER — HYDROMORPHONE HCL 1 MG/ML IJ SOLN
0.2500 mg | INTRAMUSCULAR | Status: DC | PRN
  Administered 2024-06-05 (×2): 0.25 mg via INTRAVENOUS

## 2024-06-05 MED ORDER — GABAPENTIN 300 MG PO CAPS
300.0000 mg | ORAL_CAPSULE | ORAL | Status: AC
  Administered 2024-06-05: 300 mg via ORAL

## 2024-06-05 MED ORDER — OXYCODONE HCL 5 MG PO TABS: 5.0000 mg | ORAL_TABLET | ORAL | Status: DC | PRN

## 2024-06-05 MED ORDER — LIDOCAINE HCL (PF) 2 % IJ SOLN
INTRAMUSCULAR | Status: AC
Start: 2024-06-05 — End: 2024-06-05
  Filled 2024-06-05: qty 5

## 2024-06-05 MED ORDER — MENTHOL 3 MG MT LOZG: 1.0000 | LOZENGE | OROMUCOSAL | Status: DC | PRN

## 2024-06-05 MED ORDER — LACTATED RINGERS IV BOLUS: 1000.0000 mL | Freq: Three times a day (TID) | INTRAVENOUS | Status: DC | PRN

## 2024-06-05 MED ORDER — PANTOPRAZOLE SODIUM 40 MG IV SOLR
40.0000 mg | Freq: Every day | INTRAVENOUS | Status: DC
  Administered 2024-06-05: 40 mg via INTRAVENOUS
  Filled 2024-06-05: qty 10

## 2024-06-05 MED ORDER — ACETAMINOPHEN 650 MG RE SUPP: 650.0000 mg | Freq: Four times a day (QID) | RECTAL | Status: DC | PRN

## 2024-06-05 SURGICAL SUPPLY — 27 items
BAG COUNTER SPONGE SURGICOUNT (BAG) IMPLANT
CABLE HIGH FREQUENCY MONO STRZ (ELECTRODE) ×1 IMPLANT
CATH REDDICK CHOLANGI 4FR 50CM (CATHETERS) ×1 IMPLANT
CHLORAPREP W/TINT 26 (MISCELLANEOUS) ×1 IMPLANT
CLIP APPLIE 5 13 M/L LIGAMAX5 (MISCELLANEOUS) ×1 IMPLANT
COVER MAYO STAND XLG (MISCELLANEOUS) ×1 IMPLANT
DERMABOND ADVANCED .7 DNX12 (GAUZE/BANDAGES/DRESSINGS) ×1 IMPLANT
DRAPE C-ARM 42X120 X-RAY (DRAPES) ×1 IMPLANT
ELECT REM PT RETURN 15FT ADLT (MISCELLANEOUS) ×1 IMPLANT
GLOVE BIO SURGEON STRL SZ7.5 (GLOVE) ×1 IMPLANT
GOWN STRL REUS W/ TWL LRG LVL3 (GOWN DISPOSABLE) IMPLANT
HEMOSTAT SNOW SURGICEL 2X4 (HEMOSTASIS) IMPLANT
IRRIGATION SUCT STRKRFLW 2 WTP (MISCELLANEOUS) ×1 IMPLANT
IV CATH 14GX2 1/4 (CATHETERS) ×1 IMPLANT
KIT BASIN OR (CUSTOM PROCEDURE TRAY) ×1 IMPLANT
KIT TURNOVER KIT A (KITS) ×1 IMPLANT
PENCIL SMOKE EVACUATOR (MISCELLANEOUS) IMPLANT
SCISSORS LAP 5X35 DISP (ENDOMECHANICALS) ×1 IMPLANT
SET TUBE SMOKE EVAC HIGH FLOW (TUBING) ×1 IMPLANT
SLEEVE Z-THREAD 5X100MM (TROCAR) ×2 IMPLANT
SPIKE FLUID TRANSFER (MISCELLANEOUS) ×1 IMPLANT
SUT MNCRL AB 4-0 PS2 18 (SUTURE) ×1 IMPLANT
SYSTEM BAG RETRIEVAL 10MM (BASKET) ×1 IMPLANT
TOWEL OR 17X26 10 PK STRL BLUE (TOWEL DISPOSABLE) ×1 IMPLANT
TRAY LAPAROSCOPIC (CUSTOM PROCEDURE TRAY) ×1 IMPLANT
TROCAR BALLN 12MMX100 BLUNT (TROCAR) ×1 IMPLANT
TROCAR Z-THREAD OPTICAL 5X100M (TROCAR) ×1 IMPLANT

## 2024-06-05 NOTE — Op Note (Signed)
 06/05/2024  10:41 AM  PATIENT:  Beverly Acosta  38 y.o. female  PRE-OPERATIVE DIAGNOSIS:  Cholecystitis  POST-OPERATIVE DIAGNOSIS:  Cholecystitis  PROCEDURE:  Procedure(s): LAPAROSCOPIC CHOLECYSTECTOMY (N/A)  SURGEON:  Surgeons and Role:    * Curvin Deward MOULD, MD - Primary  PHYSICIAN ASSISTANT:   ASSISTANTS: none   ANESTHESIA:   local and general  EBL:  10 mL   BLOOD ADMINISTERED:none  DRAINS: none   LOCAL MEDICATIONS USED:  MARCAINE      SPECIMEN:  Source of Specimen:  gallbladder  DISPOSITION OF SPECIMEN:  PATHOLOGY  COUNTS:  YES  TOURNIQUET:  * No tourniquets in log *  DICTATION: .Dragon Dictation    Procedure: After informed consent was obtained the patient was brought to the operating room and placed in the supine position on the operating room table. After adequate induction of general anesthesia the patient's abdomen was prepped with ChloraPrep allowed to dry and draped in usual sterile manner. An appropriate timeout was performed. The area below the umbilicus was infiltrated with quarter percent  Marcaine . A small incision was made with a 15 blade knife. The incision was carried down through the subcutaneous tissue bluntly with a hemostat and [employer] retractors. The linea alba was identified. The linea alba was incised with a 15 blade knife and each side was grasped with Coker clamps. The preperitoneal space was then probed with a hemostat until the peritoneum was opened and access was gained to the abdominal cavity. A 0 Vicryl pursestring stitch was placed in the fascia surrounding the opening. A Hassan cannula was then placed through the opening and anchored in place with the previously placed Vicryl purse string stitch. The abdomen was insufflated with carbon dioxide without difficulty. A laparoscope was inserted through the [facility] cannula in the right upper quadrant was inspected. Next the epigastric region was infiltrated with % Marcaine . A small incision was  made with a 15 blade knife. A 5 mm port was placed bluntly through this incision into the abdominal cavity under direct vision. Next 2 sites were chosen laterally on the right side of the abdomen for placement of 5 mm ports. Each of these areas was infiltrated with quarter percent Marcaine . Small stab incisions were made with a 15 blade knife. 5 mm ports were then placed bluntly through these incisions into the abdominal cavity under direct vision without difficulty. A blunt grasper was placed through the lateralmost 5 mm port and used to grasp the dome of the gallbladder and elevate it anteriorly and superiorly. Another blunt grasper was placed through the other 5 mm port and used to retract the body and neck of the gallbladder. A dissector was placed through the epigastric port and using the electrocautery the peritoneal reflection at the gallbladder neck was opened. Blunt dissection was then carried out in this area until the gallbladder neck-cystic duct junction was readily identified and a good critical window was created.  The gallbladder was significantly inflamed and we could not get a cholangiogram.  I could see her cystic duct where it joined to the gallbladder very well. 2 Clips were placed proximally on the cystic duct and 1 distally and the duct was divided between the 2 sets of clips. Posterior to this the cystic artery was identified and again dissected bluntly in a circumferential manner until a good window  was created. 2 clips were placed proximally and one distally on the artery and the artery was divided between the 2 sets of clips. Next a laparoscopic hook  cautery device was used to separate the gallbladder from the liver bed. Prior to completely detaching the gallbladder from the liver bed the liver bed was inspected and several small bleeding points were coagulated with the electrocautery until the area was completely hemostatic. The gallbladder was then detached the rest of it from the liver  bed without difficulty. A laparoscopic bag was inserted through the hassan port. The laparoscope was moved to the epigastric port. The gallbladder was placed within the bag and the bag was sealed.  The bag with the gallbladder was then removed with the [facility] cannula through the infraumbilical port without difficulty. The fascial defect was then closed with the previously placed Vicryl pursestring stitch as well as with another figure-of-eight 0 Vicryl stitch. The liver bed was inspected again and found to be hemostatic. The abdomen was irrigated with copious amounts of saline until the effluent was clear. The ports were then removed under direct vision without difficulty and were found to be hemostatic. The gas was allowed to escape. No other abnormalities were noted on general inspection of the abdomen. The skin incisions were all closed with interrupted 4-0 Monocryl subcuticular stitches. Dermabond dressings were applied. The patient tolerated the procedure well. At the end of the case all needle sponge and instrument counts were correct. The patient was then awakened and taken to recovery in stable condition  PLAN OF CARE: Admit for overnight observation  PATIENT DISPOSITION:  PACU - hemodynamically stable.   Delay start of Pharmacological VTE agent (>24hrs) due to surgical blood loss or risk of bleeding: no

## 2024-06-05 NOTE — Anesthesia Postprocedure Evaluation (Signed)
 Anesthesia Post Note  Patient: Beverly Acosta  Procedure(s) Performed: LAPAROSCOPIC CHOLECYSTECTOMY (Abdomen)     Patient location during evaluation: PACU Anesthesia Type: General Level of consciousness: awake and alert and oriented Pain management: pain level controlled Vital Signs Assessment: post-procedure vital signs reviewed and stable Respiratory status: spontaneous breathing, nonlabored ventilation and respiratory function stable Cardiovascular status: blood pressure returned to baseline and stable Postop Assessment: no apparent nausea or vomiting Anesthetic complications: no   No notable events documented.  Last Vitals:  Vitals:   06/05/24 1115 06/05/24 1130  BP: 117/63 115/68  Pulse: (!) 48 62  Resp: 20 15  Temp:    SpO2: 100% 100%    Last Pain:  Vitals:   06/05/24 1130  TempSrc:   PainSc: 3                  Iam Lipson A.

## 2024-06-05 NOTE — Consult Note (Signed)
 Beverly Acosta  Jan 13, 1986 [id number]  CARE TEAM:  PCP: Patient, No Pcp Per  Outpatient Care Team: Patient Care Team: Patient, No Pcp Per as PCP - General (General Practice)  Inpatient Treatment Team: Treatment Team:  Patsey Lot, MD Ermalinda Shuck, NT Diggs, Myra J, RN Sponseller, Rebekah R, PA-C [facility], Md, MD   This patient is a 38 y.o.female who presents today for surgical evaluation at the request of Pleasant Eng, PA   Chief complaint / Reason for evaluation: Recurrent gallbladder attacks now with probable cholecystitis  38 year old morbidly obese female with 5-year history of intermittent nausea vomiting abdominal pain.  Has had prior ER visits with known gallstones.  Recommended to follow-up with general surgery.  I see no records of her seeing [facility] surgery.  She had recurrent attacks.  She had an attack and started 12 hours ago.  More constant pain with epigastric burning and discomfort.  Radiating to her back.  Worsening nausea and vomiting.  No fevers or chills but not getting better.  Came to emergency department.  History physical exam and CT scan concerning for cholecystitis  Assessment  Beverly Acosta  38 y.o. female       Problem List:  Active Problems:   Acute cholecystitis   Morbid obesity with body mass index (BMI) of 40.0 to 49.9 (HCC)   Recurrent gallbladder attacks now with leukocytosis pain and CAT scan concerning for cholecystitis acute on chronic  Plan:  IV antibiotics.  Nausea pain control.  Fluids.  Laparoscopic cholecystectomy with possible cholangiogram.  More likely is standard 4 port cholecystectomy given her morbid obesity and inflammation of gallbladder    I reviewed nursing notes, ED provider notes, last 24 h vitals and pain scores, last 48 h intake and output, last 24 h labs and trends, and last 24 h imaging results. I have reviewed this patient's available data, including medical history, events of note, test results,  etc as part of my evaluation.  A significant portion of that time was spent in counseling.  Care during the described time interval was provided by me.  This care required moderate level of medical decision making.  06/05/2024  [name], MD, FACS, MASCRS Esophageal, Gastrointestinal & Colorectal Surgery Robotic and Minimally Invasive Surgery  [facility] [address], [address] [place], [place] [address] [phone] Fax [phone] Main  CONTACT INFORMATION: Weekday (9AM-5PM): Call [facility] main office at [phone] Weeknight (5PM-9AM) or Weekend/Holiday: Check EPIC Web Links tab & use AMION (password  [email or web address]) for General Surgery [facility] coverage  Please, DO NOT use SecureChat  (it is not reliable communication to reach operating surgeons & will lead to a delay in care).   Epic staff messaging available for outptient concerns needing 1-2 business day response.      06/05/2024      History reviewed. No pertinent past medical history.  History reviewed. No pertinent surgical history.  Social History   Socioeconomic History   Marital status: Significant Other    Spouse name: Not on file   Number of children: Not on file   Years of education: Not on file   Highest education level: Not on file  Occupational History   Not on file  Tobacco Use   Smoking status: Every Day    Current packs/day: 0.50    Types: Cigarettes   Smokeless tobacco: Never  Substance and Sexual Activity   Alcohol use: Not Currently  Drug use: Not Currently   Sexual activity: Not on file  Other Topics Concern   Not on file  Social History Narrative   Not on file   Social Drivers of Health   Financial Resource Strain: Not on file  Food Insecurity: Not on file  Transportation Needs: Not on file  Physical Activity: Not on file  Stress: Not on file  Social Connections: Not on file  Intimate Partner Violence: Not on file     History reviewed. No pertinent family history.  Current Facility-Administered Medications  Medication Dose Route Frequency Provider Last Rate Last Admin   acetaminophen  (TYLENOL ) suppository 650 mg  650 mg Rectal Q6H PRN Sheldon Standing, MD       acetaminophen  (TYLENOL ) tablet 1,000 mg  1,000 mg Oral On Call to OR Sheldon Standing, MD       acetaminophen  (TYLENOL ) tablet 325-650 mg  325-650 mg Oral Q6H PRN Sheldon Standing, MD       alum & mag hydroxide-simeth (MAALOX/MYLANTA) 200-200-20 MG/5ML suspension 30 mL  30 mL Oral Q6H PRN Sheldon Standing, MD       cefTRIAXone  (ROCEPHIN ) 2 g in sodium chloride  0.9 % 100 mL IVPB  2 g Intravenous Q24H Sheldon Standing, MD       celecoxib  (CELEBREX ) capsule 200 mg  200 mg Oral On Call to OR Sheldon Standing, MD       Chlorhexidine  Gluconate Cloth 2 % PADS 6 each  6 each Topical Once Sheldon Standing, MD       And   Chlorhexidine  Gluconate Cloth 2 % PADS 6 each  6 each Topical Once Sheldon Standing, MD       diphenhydrAMINE  (BENADRYL ) injection 12.5-25 mg  12.5-25 mg Intravenous Q6H PRN Sheldon Standing, MD       NOREEN ON 06/06/2024] feeding supplement (ENSURE PRE-SURGERY) liquid 296 mL  296 mL Oral Once Sheldon Standing, MD       gabapentin  (NEURONTIN ) capsule 300 mg  300 mg Oral On Call to OR Sheldon Standing, MD       HYDROmorphone  (DILAUDID ) injection 0.5-2 mg  0.5-2 mg Intravenous Q2H PRN Sheldon Standing, MD       ketorolac  (TORADOL ) 15 MG/ML injection 15-30 mg  15-30 mg Intravenous Q6H PRN Sheldon Standing, MD       lactated ringers  bolus 1,000 mL  1,000 mL Intravenous Q8H PRN Sheldon Standing, MD       lactated ringers  bolus 1,000 mL  1,000 mL Intravenous Once Sheldon Standing, MD       lactated ringers  infusion   Intravenous Continuous Sheldon Standing, MD       magic mouthwash  15 mL Oral QID PRN Sheldon Standing, MD       menthol -cetylpyridinium (CEPACOL) lozenge 3 mg  1 lozenge Oral PRN Sheldon Standing, MD       methocarbamol  (ROBAXIN ) injection 1,000 mg  1,000 mg Intravenous Q6H PRN  Sheldon Standing, MD       methocarbamol  (ROBAXIN ) tablet 1,000 mg  1,000 mg Oral Q6H PRN Sheldon Standing, MD       metoprolol  tartrate (LOPRESSOR ) injection 5 mg  5 mg Intravenous Q6H PRN Sheldon Standing, MD       naphazoline-glycerin  (CLEAR EYES REDNESS) ophth solution 1-2 drop  1-2 drop Both Eyes QID PRN Sheldon Standing, MD       ondansetron  (ZOFRAN ) injection 4 mg  4 mg Intravenous Q6H PRN Sheldon Standing, MD       oxyCODONE  (Oxy IR/ROXICODONE ) immediate release tablet 5-10 mg  5-10 mg Oral Q4H PRN Sheldon Standing, MD       phenol (CHLORASEPTIC) mouth spray 2 spray  2 spray Mouth/Throat PRN Sheldon Standing, MD       piperacillin -tazobactam (ZOSYN ) IVPB 3.375 g  3.375 g Intravenous Once Sponseller, Rebekah R, PA-C 100 mL/hr at 06/05/24 0022 3.375 g at 06/05/24 0022   prochlorperazine  (COMPAZINE ) injection 5-10 mg  5-10 mg Intravenous Q4H PRN Sheldon Standing, MD       simethicone  (MYLICON) 40 MG/0.6ML suspension 80 mg  80 mg Oral QID PRN Sheldon Standing, MD       sodium chloride  (OCEAN) 0.65 % nasal spray 1-2 spray  1-2 spray Each Nare Q6H PRN Sheldon Standing, MD       Current Outpatient Medications  Medication Sig Dispense Refill   ondansetron  (ZOFRAN  ODT) 4 MG disintegrating tablet Take 1 tablet (4 mg total) by mouth every 8 (eight) hours as needed for nausea or vomiting. 20 tablet 0     Allergies  Allergen Reactions   Sulfamethoxazole-Trimethoprim Swelling     BP (!) 147/81   Pulse 61   Temp 98 F (36.7 C)   Resp 20   Ht 5' 4 (1.626 m)   Wt 108.9 kg   SpO2 100%   BMI 41.21 kg/m     Results:   Labs: Results for orders placed or performed during the hospital encounter of 06/04/24 (from the past 48 hours)  CBC     Status: Abnormal   Collection Time: 06/04/24  6:28 PM  Result Value Ref Range   WBC 15.9 (H) 4.0 - 10.5 K/uL   RBC 5.12 (H) 3.87 - 5.11 MIL/uL   Hemoglobin 15.1 (H) 12.0 - 15.0 g/dL   HCT 53.0 (H) 63.9 - 53.9 %   MCV 91.6 80.0 - 100.0 fL   MCH 29.5 26.0 - 34.0 pg   MCHC 32.2  30.0 - 36.0 g/dL   RDW 86.9 88.4 - 84.4 %   Platelets 299 150 - 400 K/uL   nRBC 0.0 0.0 - 0.2 %    Comment: Performed at [facility], [address] [place], [place] [address]  Troponin I (High Sensitivity)     Status: None   Collection Time: 06/04/24  6:28 PM  Result Value Ref Range   Troponin I (High Sensitivity) <2 <18 ng/L    Comment: (NOTE) Elevated high sensitivity troponin I (hsTnI) values and significant  changes across serial measurements may suggest ACS but many other  chronic and acute conditions are known to elevate hsTnI results.  Refer to the Links section for chest pain algorithms and additional  guidance. Performed at [facility], [address], [place], [place] [address]   Lipase, blood     Status: None   Collection Time: 06/04/24  6:28 PM  Result Value Ref Range   Lipase 28 11 - 51 U/L    Comment: Performed at [facility], [address] [place], [place] [address]  Comprehensive metabolic panel     Status: Abnormal   Collection Time: 06/04/24  6:28 PM  Result Value Ref Range   Sodium 137 135 - 145 mmol/L   Potassium 3.6 3.5 - 5.1 mmol/L   Chloride 104 98 - 111 mmol/L   CO2 23 22 - 32 mmol/L   Glucose, Bld 107 (H) 70 - 99 mg/dL    Comment: Glucose reference range applies only to samples taken after fasting for at least 8 hours.   BUN 13  6 - 20 mg/dL   Creatinine, Ser 9.19 0.44 - 1.00 mg/dL   Calcium 9.8 8.9 - 89.6 mg/dL   Total Protein 7.5 6.5 - 8.1 g/dL   Albumin 4.2 3.5 - 5.0 g/dL   AST 16 15 - 41 U/L   ALT 14 0 - 44 U/L   Alkaline Phosphatase 58 38 - 126 U/L   Total Bilirubin 0.8 0.0 - 1.2 mg/dL   GFR, Estimated >39 >39 mL/min    Comment: (NOTE) Calculated using the CKD-EPI Creatinine Equation (2021)    Anion gap 10 5 - 15    Comment: Performed at [facility], [address] [place], [place] [address]  Magnesium     Status: None   Collection Time: 06/04/24  7:49 PM   Result Value Ref Range   Magnesium 2.0 1.7 - 2.4 mg/dL    Comment: Performed at [facility], [address] [place], [place] [address]  Troponin I (High Sensitivity)     Status: None   Collection Time: 06/04/24  7:49 PM  Result Value Ref Range   Troponin I (High Sensitivity) <2 <18 ng/L    Comment: (NOTE) Elevated high sensitivity troponin I (hsTnI) values and significant  changes across serial measurements may suggest ACS but many other  chronic and acute conditions are known to elevate hsTnI results.  Refer to the Links section for chest pain algorithms and additional  guidance. Performed at [facility], [address], [place], [place] [address]   hCG, serum, qualitative     Status: None   Collection Time: 06/04/24  8:07 PM  Result Value Ref Range   Preg, Serum NEGATIVE NEGATIVE    Comment:        THE SENSITIVITY OF THIS METHODOLOGY IS >10 mIU/mL. Performed at [facility], [address] [place], [place] [address]     Imaging / Studies: CT ABDOMEN PELVIS W CONTRAST Result Date: 06/04/2024 CLINICAL DATA:  Biliary obstruction suspected. Chest pain and ongoing issues with gallbladder and right upper quadrant pain. EXAM: CT ABDOMEN AND PELVIS WITH CONTRAST TECHNIQUE: Multidetector CT imaging of the abdomen and pelvis was performed using the standard protocol following bolus administration of intravenous contrast. RADIATION DOSE REDUCTION: This exam was performed according to the departmental dose-optimization program which includes automated exposure control, adjustment of the mA and/or kV according to patient size and/or use of iterative reconstruction technique. CONTRAST:  OMNIPAQUE  IOHEXOL  300 MG/ML  SOLN COMPARISON:  Ultrasound 07/30/2023 FINDINGS: Lower chest: Ground-glass opacities along the elevated right hemidiaphragm are favored due to chronic scarring. Atypical infection not excluded. Hepatobiliary: Focal  fatty infiltration along the falciform ligament. Gallbladder wall thickening. Multiple gallstones in the gallbladder neck. No biliary dilation. Pancreas: Unremarkable. No pancreatic ductal dilatation or surrounding inflammatory changes. Spleen: Unremarkable. Adrenals/Urinary Tract: Normal adrenal glands and kidneys. No urinary calculi or hydronephrosis. Unremarkable bladder. Stomach/Bowel: Normal caliber large and small bowel without bowel wall thickening. Stomach and appendix are within normal limits. Vascular/Lymphatic: No significant vascular findings are present. No enlarged abdominal or pelvic lymph nodes. Reproductive: Uterus and bilateral adnexa are unremarkable. Other: No free intraperitoneal fluid or air. Musculoskeletal: No acute fracture. IMPRESSION: 1. Cholelithiasis with gallbladder wall thickening. Findings are concerning for acute cholecystitis. Electronically Signed   By: Norman Gatlin M.D.   On: 06/04/2024 23:33   DG Chest 2 View Result Date: 06/04/2024 CLINICAL DATA:  Chest pain and ongoing issues with bladder and right upper quadrant pain EXAM: CHEST -  2 VIEW COMPARISON:  08/04/2014 FINDINGS: The heart size and mediastinal contours are within normal limits. Both lungs are clear. The visualized skeletal structures are unremarkable. IMPRESSION: No active cardiopulmonary disease. Electronically Signed   By: Norman Gatlin M.D.   On: 06/04/2024 19:40    Medications / Allergies: per chart  Antibiotics: Anti-infectives (From admission, onward)    Start     Dose/Rate Route Frequency Ordered Stop   06/05/24 0030  cefTRIAXone  (ROCEPHIN ) 2 g in sodium chloride  0.9 % 100 mL IVPB       Note to Pharmacy: Pharmacy may adjust dosing strength / duration / interval for maximal efficacy   2 g 200 mL/hr over 30 Minutes Intravenous Every 24 hours 06/05/24 0029     06/05/24 0015  piperacillin -tazobactam (ZOSYN ) IVPB 3.375 g        3.375 g 100 mL/hr over 30 Minutes Intravenous  Once 06/05/24 0011            Note: Portions of this report may have been transcribed using voice recognition software. Every effort was made to ensure accuracy; however, inadvertent computerized transcription errors may be present.   Any transcriptional errors that result from this process are unintentional.    [name], MD, FACS, MASCRS Esophageal, Gastrointestinal & Colorectal Surgery Robotic and Minimally Invasive Surgery  [facility] [address] [place], [place] [address] [phone] Fax [phone] Main  CONTACT INFORMATION: Weekday (9AM-5PM): Call [facility] main office at [phone] Weeknight (5PM-9AM) or Weekend/Holiday: Check EPIC Web Links tab & use AMION (password  [email or web address]) for General Surgery [facility] coverage  Please, DO NOT use SecureChat  (it is not reliable communication to reach operating surgeons & will lead to a delay in care).   Epic staff messaging available for outptient concerns needing 1-2 business day response.       06/05/2024  12:31 AM

## 2024-06-05 NOTE — Anesthesia Preprocedure Evaluation (Addendum)
 Anesthesia Evaluation  Patient identified by MRN, date of birth, ID band Patient awake    Reviewed: Allergy & Precautions, NPO status , Patient's Chart, lab work & pertinent test results  Airway Mallampati: III  TM Distance: >3 FB   Mouth opening: Limited Mouth Opening Comment: Pierced upper lip Dental no notable dental hx. (+) Dental Advisory Given, Teeth Intact   Pulmonary Current Smoker and Patient abstained from smoking.   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 interference limb leads   Neuro/Psych negative neurological ROS  negative psych ROS   GI/Hepatic Neg liver ROS,,,Cholelithiasis with acute cholecystitis   Endo/Other    Class 3 obesity  Renal/GU negative Renal ROS  negative genitourinary   Musculoskeletal negative musculoskeletal ROS (+)    Abdominal  (+) + obese  Peds  Hematology negative hematology ROS (+)   Anesthesia Other Findings   Reproductive/Obstetrics negative OB ROS                              Anesthesia Physical Anesthesia Plan  ASA: 3 and emergent  Anesthesia Plan: General   Post-op Pain Management: Minimal or no pain anticipated   Induction: Intravenous, Cricoid pressure planned and Rapid sequence  PONV Risk Score and Plan: 4 or greater and Scopolamine  patch - Pre-op, Treatment may vary due to age or medical condition, Midazolam , Ondansetron  and Dexamethasone   Airway Management Planned: Oral ETT and Video Laryngoscope Planned  Additional Equipment: None  Intra-op Plan:   Post-operative Plan: Extubation in OR  Informed Consent: I have reviewed the patients History and Physical, chart, labs and discussed the procedure including the risks, benefits and alternatives for the proposed  anesthesia with the patient or authorized representative who has indicated his/her understanding and acceptance.     Dental advisory given  Plan Discussed with: Anesthesiologist and CRNA  Anesthesia Plan Comments:          Anesthesia Quick Evaluation

## 2024-06-05 NOTE — Anesthesia Procedure Notes (Addendum)
 Procedure Name: Intubation Date/Time: 06/05/2024 9:39 AM  Performed by: Delores Duwaine SAUNDERS, CRNAPre-anesthesia Checklist: Patient identified, Emergency Drugs available, Suction available and Patient being monitored Patient Re-evaluated:Patient Re-evaluated prior to induction Oxygen Delivery Method: Circle System Utilized Preoxygenation: Pre-oxygenation with 100% oxygen Induction Type: IV induction and Rapid sequence Ventilation: Mask ventilation without difficulty Laryngoscope Size: Glidescope and 3 Grade View: Grade I Tube type: Oral Tube size: 7.0 mm Number of attempts: 1 Airway Equipment and Method: Stylet and Oral airway Placement Confirmation: ETT inserted through vocal cords under direct vision, positive ETCO2 and breath sounds checked- equal and bilateral Secured at: 21 cm Tube secured with: Tape Dental Injury: Teeth and Oropharynx as per pre-operative assessment  Comments: Straight to glide d/t RSI, small mouth opening

## 2024-06-05 NOTE — H&P (Signed)
 Beverly Acosta is an 38 y.o. female.   Chief Complaint: abd pain HPI: The patient is a 38 year old white female who presents with abdominal pain that started yesterday.  The pain was located mostly in the right upper quadrant.  The pain was associated with significant nausea and vomiting.  She has had similar pains off and on for the last 5 years.  She has known gallstones.  Her recent scan yesterday in the emergency department also showed inflammation of the gallbladder.  Her liver functions were normal.  Her white count was 15.9.  History reviewed. No pertinent past medical history.  History reviewed. No pertinent surgical history.  History reviewed. No pertinent family history. Social History:  reports that she has been smoking cigarettes. She has never used smokeless tobacco. She reports that she does not currently use alcohol. She reports that she does not currently use drugs.  Allergies:  Allergies  Allergen Reactions   Sulfamethoxazole-Trimethoprim Swelling    (Not in a hospital admission)   Results for orders placed or performed during the hospital encounter of 06/04/24 (from the past 48 hours)  CBC     Status: Abnormal   Collection Time: 06/04/24  6:28 PM  Result Value Ref Range   WBC 15.9 (H) 4.0 - 10.5 K/uL   RBC 5.12 (H) 3.87 - 5.11 MIL/uL   Hemoglobin 15.1 (H) 12.0 - 15.0 g/dL   HCT 53.0 (H) 63.9 - 53.9 %   MCV 91.6 80.0 - 100.0 fL   MCH 29.5 26.0 - 34.0 pg   MCHC 32.2 30.0 - 36.0 g/dL   RDW 86.9 88.4 - 84.4 %   Platelets 299 150 - 400 K/uL   nRBC 0.0 0.0 - 0.2 %    Comment: Performed at [facility], [address], [place], [place] [address]  Troponin I (High Sensitivity)     Status: None   Collection Time: 06/04/24  6:28 PM  Result Value Ref Range   Troponin I (High Sensitivity) <2 <18 ng/L    Comment: (NOTE) Elevated high sensitivity troponin I (hsTnI) values and significant  changes across serial measurements may suggest ACS but many  other  chronic and acute conditions are known to elevate hsTnI results.  Refer to the Links section for chest pain algorithms and additional  guidance. Performed at [facility], [address], [place], [place] [address]   Lipase, blood     Status: None   Collection Time: 06/04/24  6:28 PM  Result Value Ref Range   Lipase 28 11 - 51 U/L    Comment: Performed at [facility], [address], [place], [place] [address]  Comprehensive metabolic panel     Status: Abnormal   Collection Time: 06/04/24  6:28 PM  Result Value Ref Range   Sodium 137 135 - 145 mmol/L   Potassium 3.6 3.5 - 5.1 mmol/L   Chloride 104 98 - 111 mmol/L   CO2 23 22 - 32 mmol/L   Glucose, Bld 107 (H) 70 - 99 mg/dL    Comment: Glucose reference range applies only to samples taken after fasting for at least 8 hours.   BUN 13 6 - 20 mg/dL   Creatinine, Ser 9.19 0.44 - 1.00 mg/dL   Calcium 9.8 8.9 - 89.6 mg/dL   Total Protein 7.5 6.5 - 8.1 g/dL   Albumin 4.2 3.5 - 5.0 g/dL   AST 16 15 - 41 U/L   ALT 14 0 - 44 U/L  Alkaline Phosphatase 58 38 - 126 U/L   Total Bilirubin 0.8 0.0 - 1.2 mg/dL   GFR, Estimated >39 >39 mL/min    Comment: (NOTE) Calculated using the CKD-EPI Creatinine Equation (2021)    Anion gap 10 5 - 15    Comment: Performed at [facility], [address] [place], [place] [address]  Magnesium     Status: None   Collection Time: 06/04/24  7:49 PM  Result Value Ref Range   Magnesium 2.0 1.7 - 2.4 mg/dL    Comment: Performed at [facility], [address] [place], [place] [address]  Troponin I (High Sensitivity)     Status: None   Collection Time: 06/04/24  7:49 PM  Result Value Ref Range   Troponin I (High Sensitivity) <2 <18 ng/L    Comment: (NOTE) Elevated high sensitivity troponin I (hsTnI) values and significant  changes across serial measurements may suggest ACS but many other  chronic and acute conditions are  known to elevate hsTnI results.  Refer to the Links section for chest pain algorithms and additional  guidance. Performed at [facility], [address] [place], [place] [address]   hCG, serum, qualitative     Status: None   Collection Time: 06/04/24  8:07 PM  Result Value Ref Range   Preg, Serum NEGATIVE NEGATIVE    Comment:        THE SENSITIVITY OF THIS METHODOLOGY IS >10 mIU/mL. Performed at [facility], [address], [place], [place] [address]   Urinalysis, Routine w reflex microscopic -Urine, Clean Catch     Status: Abnormal   Collection Time: 06/05/24 12:16 AM  Result Value Ref Range   Color, Urine YELLOW YELLOW   APPearance CLEAR CLEAR   Specific Gravity, Urine >1.046 (H) 1.005 - 1.030   pH 6.0 5.0 - 8.0   Glucose, UA NEGATIVE NEGATIVE mg/dL   Hgb urine dipstick SMALL (A) NEGATIVE   Bilirubin Urine NEGATIVE NEGATIVE   Ketones, ur NEGATIVE NEGATIVE mg/dL   Protein, ur NEGATIVE NEGATIVE mg/dL   Nitrite NEGATIVE NEGATIVE   Leukocytes,Ua NEGATIVE NEGATIVE   RBC / HPF 0-5 0 - 5 RBC/hpf   WBC, UA 0-5 0 - 5 WBC/hpf   Bacteria, UA NONE SEEN NONE SEEN   Squamous Epithelial / HPF 0-5 0 - 5 /HPF   Mucus PRESENT     Comment: Performed at [facility], [address] [place], [place] [address]   CT ABDOMEN PELVIS W CONTRAST Result Date: 06/04/2024 CLINICAL DATA:  Biliary obstruction suspected. Chest pain and ongoing issues with gallbladder and right upper quadrant pain. EXAM: CT ABDOMEN AND PELVIS WITH CONTRAST TECHNIQUE: Multidetector CT imaging of the abdomen and pelvis was performed using the standard protocol following bolus administration of intravenous contrast. RADIATION DOSE REDUCTION: This exam was performed according to the departmental dose-optimization program which includes automated exposure control, adjustment of the mA and/or kV according to patient size and/or use of iterative reconstruction technique.  CONTRAST:  OMNIPAQUE  IOHEXOL  300 MG/ML  SOLN COMPARISON:  Ultrasound 07/30/2023 FINDINGS: Lower chest: Ground-glass opacities along the elevated right hemidiaphragm are favored due to chronic scarring. Atypical infection not excluded. Hepatobiliary: Focal fatty infiltration along the falciform ligament. Gallbladder wall thickening. Multiple gallstones in the gallbladder neck. No biliary dilation. Pancreas: Unremarkable. No pancreatic ductal dilatation or surrounding inflammatory changes. Spleen: Unremarkable. Adrenals/Urinary Tract: Normal adrenal glands and kidneys. No urinary calculi or hydronephrosis. Unremarkable bladder. Stomach/Bowel: Normal caliber  large and small bowel without bowel wall thickening. Stomach and appendix are within normal limits. Vascular/Lymphatic: No significant vascular findings are present. No enlarged abdominal or pelvic lymph nodes. Reproductive: Uterus and bilateral adnexa are unremarkable. Other: No free intraperitoneal fluid or air. Musculoskeletal: No acute fracture. IMPRESSION: 1. Cholelithiasis with gallbladder wall thickening. Findings are concerning for acute cholecystitis. Electronically Signed   By: Norman Gatlin M.D.   On: 06/04/2024 23:33   DG Chest 2 View Result Date: 06/04/2024 CLINICAL DATA:  Chest pain and ongoing issues with bladder and right upper quadrant pain EXAM: CHEST - 2 VIEW COMPARISON:  08/04/2014 FINDINGS: The heart size and mediastinal contours are within normal limits. Both lungs are clear. The visualized skeletal structures are unremarkable. IMPRESSION: No active cardiopulmonary disease. Electronically Signed   By: Norman Gatlin M.D.   On: 06/04/2024 19:40    Review of Systems  Constitutional: Negative.   HENT: Negative.    Eyes: Negative.   Respiratory: Negative.    Cardiovascular: Negative.   Gastrointestinal:  Positive for abdominal pain, nausea and vomiting.  Endocrine: Negative.   Genitourinary: Negative.   Musculoskeletal:  Negative.   Skin: Negative.   Allergic/Immunologic: Negative.   Neurological: Negative.   Hematological: Negative.   Psychiatric/Behavioral: Negative.      Blood pressure (!) 115/54, pulse (!) 45, temperature 98.1 F (36.7 C), temperature source Oral, resp. rate 17, height 5' 4 (1.626 m), weight 108.9 kg, SpO2 97%. Physical Exam Vitals reviewed.  Constitutional:      General: She is not in acute distress.    Appearance: Normal appearance. She is obese.  HENT:     Head: Normocephalic and atraumatic.     Right Ear: External ear normal.     Left Ear: External ear normal.     Nose: Nose normal.     Mouth/Throat:     Mouth: Mucous membranes are moist.     Pharynx: Oropharynx is clear.  Eyes:     General: No scleral icterus.    Extraocular Movements: Extraocular movements intact.     Conjunctiva/sclera: Conjunctivae normal.     Pupils: Pupils are equal, round, and reactive to light.  Cardiovascular:     Rate and Rhythm: Regular rhythm. Bradycardia present.     Pulses: Normal pulses.     Heart sounds: Normal heart sounds.  Pulmonary:     Effort: Pulmonary effort is normal. No respiratory distress.     Breath sounds: Normal breath sounds.  Abdominal:     General: Abdomen is flat.     Palpations: Abdomen is soft.     Comments: There is mild RUQ tenderness  Musculoskeletal:        General: No swelling or deformity. Normal range of motion.     Cervical back: Normal range of motion and neck supple.  Skin:    General: Skin is warm and dry.     Coloration: Skin is not jaundiced.  Neurological:     General: No focal deficit present.     Mental Status: She is alert and oriented to person, place, and time.  Psychiatric:        Mood and Affect: Mood normal.        Behavior: Behavior normal.      Assessment/Plan The patient appears to have cholecystitis with cholelithiasis.  Because of the risk of further painful episodes and possible pancreatitis I feel she would benefit from  having her gallbladder removed.  I have discussed with her in detail the risks  and benefits of the operation as well as some of the technical aspects including the risk of common bile duct injury and she understands and wishes to proceed.  She is currently having some significant bradycardia.  I will discuss this with anesthesia.  She may need a cardiac workup prior to surgery.  Deward Null III, MD 06/05/2024, 7:55 AM

## 2024-06-05 NOTE — ED Notes (Signed)
Nurse notified of vitals.

## 2024-06-05 NOTE — Transfer of Care (Signed)
 Immediate Anesthesia Transfer of Care Note  Patient: Beverly Acosta  Procedure(s) Performed: LAPAROSCOPIC CHOLECYSTECTOMY (Abdomen)  Patient Location: PACU  Anesthesia Type:General  Level of Consciousness: awake and alert   Airway & Oxygen Therapy: Patient Spontanous Breathing  Post-op Assessment: Report given to RN  Post vital signs: Reviewed and stable  Last Vitals:  Vitals Value Taken Time  BP 113/59 06/05/24 10:51  Temp    Pulse 67 06/05/24 10:54  Resp 15 06/05/24 10:54  SpO2 97 % 06/05/24 10:54  Vitals shown include unfiled device data.  Last Pain:  Vitals:   06/05/24 0844  TempSrc: Oral  PainSc: 0-No pain         Complications: No notable events documented.

## 2024-06-06 ENCOUNTER — Encounter (HOSPITAL_COMMUNITY): Payer: Self-pay | Admitting: General Surgery

## 2024-06-06 LAB — COMPREHENSIVE METABOLIC PANEL WITH GFR
ALT: 26 U/L (ref 0–44)
AST: 28 U/L (ref 15–41)
Albumin: 3.2 g/dL — ABNORMAL LOW (ref 3.5–5.0)
Alkaline Phosphatase: 50 U/L (ref 38–126)
Anion gap: 10 (ref 5–15)
BUN: 10 mg/dL (ref 6–20)
CO2: 22 mmol/L (ref 22–32)
Calcium: 8.7 mg/dL — ABNORMAL LOW (ref 8.9–10.3)
Chloride: 110 mmol/L (ref 98–111)
Creatinine, Ser: 0.88 mg/dL (ref 0.44–1.00)
GFR, Estimated: >60 mL/min (ref >60)
Glucose, Bld: 139 mg/dL — ABNORMAL HIGH (ref 70–99)
Potassium: 3.6 mmol/L (ref 3.5–5.1)
Sodium: 142 mmol/L (ref 135–145)
Total Bilirubin: 0.6 mg/dL (ref 0.0–1.2)
Total Protein: 5.9 g/dL — ABNORMAL LOW (ref 6.5–8.1)

## 2024-06-06 MED ORDER — AMOXICILLIN-POT CLAVULANATE 875-125 MG PO TABS: 1.0000 | ORAL_TABLET | Freq: Two times a day (BID) | ORAL | 0 refills | Status: AC

## 2024-06-06 MED ORDER — OXYCODONE HCL 5 MG PO TABS: 5.0000 mg | ORAL_TABLET | Freq: Four times a day (QID) | ORAL | 0 refills | Status: AC | PRN

## 2024-06-06 NOTE — TOC Initial Note (Signed)
 Transition of Care [facility]) - Initial/Assessment Note    Patient Details  Name: Beverly Acosta MRN: [id number] Date of Birth: 15-Mar-1986  [facility]) CM/SW Contact:    Sonda Manuella Quill, RN Phone Number: 06/06/2024, 9:17 AM  Clinical Narrative:                 No PCP listed; spoke w/ pt in room; pt says she lives at home; she plans to return at d/c; pt identified POC Dailey Alberson (mother) [phone]; pt says she has transportation; she says her PCP is at [facility]; pt verified insurance; she denied SDOH risks; she does not have DME, HH services, or home oxygen; no TOC needs.  Expected Discharge Plan: Home/Self Care Barriers to Discharge: No Barriers Identified   Patient Goals and CMS Choice Patient states their goals for this hospitalization and ongoing recovery are:: home          Expected Discharge Plan and Services   Discharge Planning Services: CM Consult   Living arrangements for the past 2 months: Single Family Home Expected Discharge Date: 06/06/24               DME Arranged: N/A DME Agency: NA       HH Arranged: NA HH Agency: NA        Prior Living Arrangements/Services Living arrangements for the past 2 months: Single Family Home Lives with:: Self Patient language and need for interpreter reviewed:: Yes Do you feel safe going back to the place where you live?: Yes      Need for Family Participation in Patient Care: Yes (Comment) Care giver support system in place?: Yes (comment) Current home services:  (n/a) Criminal Activity/Legal Involvement Pertinent to Current Situation/Hospitalization: No - Comment as needed  Activities of Daily Living   ADL Screening (condition at time of admission) Independently performs ADLs?: Yes (appropriate for developmental age) Is the patient deaf or have difficulty hearing?: No Does the patient have difficulty seeing, even when wearing glasses/contacts?: No Does the patient have difficulty  concentrating, remembering, or making decisions?: No  Permission Sought/Granted Permission sought to share information with : Case Manager Permission granted to share information with : Yes, Verbal Permission Granted  Share Information with NAME: Case Manager     Permission granted to share info w Relationship: Ladana Chavero (mother) [phone]     Emotional Assessment Appearance:: Appears stated age Attitude/Demeanor/Rapport: Gracious Affect (typically observed): Accepting Orientation: : Oriented to Self, Oriented to Place, Oriented to  Time, Oriented to Situation Alcohol / Substance Use: Not Applicable Psych Involvement: No (comment)  Admission diagnosis:  Cholecystitis with cholelithiasis [K80.10] Calculus of gallbladder with acute cholecystitis without obstruction [K80.00] Patient Active Problem List   Diagnosis Date Noted   Acute cholecystitis 06/05/2024   Morbid obesity with body mass index (BMI) of 40.0 to 49.9 (HCC) 06/05/2024   Cholecystitis with cholelithiasis 06/05/2024   PCP:  Patient, No Pcp Per Pharmacy:   [id number] [place] [address] [place] [address] Phone: [phone] Fax: [phone]  [id number] [place], [place] - [address] [facility] [address] [facility] [address] [place] [address] Phone: [phone] Fax: [phone]     Social Drivers of Health (SDOH) Social History: SDOH Screenings   Food Insecurity: No Food Insecurity (06/06/2024)  Housing: Low Risk  (06/06/2024)  Transportation Needs: No Transportation Needs (06/06/2024)  Utilities: Not At Risk (06/06/2024)  Social Connections: Moderately Integrated (06/05/2024)  Tobacco Use: High Risk (06/04/2024)   SDOH Interventions: Food Insecurity Interventions: Intervention Not Indicated, Inpatient TOC Housing Interventions: Intervention Not Indicated, Inpatient TOC Transportation Interventions: Intervention Not Indicated,  Inpatient TOC Utilities Interventions: Intervention Not Indicated, Inpatient TOC   Readmission Risk Interventions     No data to display

## 2024-06-06 NOTE — Progress Notes (Signed)
 1 Day Post-Op   Subjective/Chief Complaint: Complains of soreness. Otherwise ok   Objective: Vital signs in last 24 hours: Temp:  [97.1 F (36.2 C)-98.8 F (37.1 C)] 98 F (36.7 C) (07/05 0522) Pulse Rate:  [46-81] 76 (07/05 0522) Resp:  [14-20] 18 (07/05 0522) BP: (102-133)/(52-97) 102/52 (07/05 0522) SpO2:  [91 %-100 %] 95 % (07/05 0522) Last BM Date : 06/04/24  Intake/Output from previous day: 07/04 0701 - 07/05 0700 In: 2404.8 [P.O.:1200; I.V.:1154.9; IV Piggyback:49.9] Out: 360 [Urine:350; Blood:10] Intake/Output this shift: No intake/output data recorded.  General appearance: alert and cooperative Resp: clear to auscultation bilaterally Cardio: regular rate and rhythm GI: soft, mild tenderness. Tolerated diet  Lab Results:  Recent Labs    06/04/24 1828  WBC 15.9*  HGB 15.1*  HCT 46.9*  PLT 299   BMET Recent Labs    06/04/24 1828 06/06/24 0428  NA 137 142  K 3.6 3.6  CL 104 110  CO2 23 22  GLUCOSE 107* 139*  BUN 13 10  CREATININE 0.80 0.88  CALCIUM 9.8 8.7*   PT/INR No results for input(s): LABPROT, INR in the last 72 hours. ABG No results for input(s): PHART, HCO3 in the last 72 hours.  Invalid input(s): PCO2, PO2  Studies/Results: DG C-Arm 1-60 Min-No Report Result Date: 06/05/2024 Fluoroscopy was utilized by the requesting physician.  No radiographic interpretation.   CT ABDOMEN PELVIS W CONTRAST Result Date: 06/04/2024 CLINICAL DATA:  Biliary obstruction suspected. Chest pain and ongoing issues with gallbladder and right upper quadrant pain. EXAM: CT ABDOMEN AND PELVIS WITH CONTRAST TECHNIQUE: Multidetector CT imaging of the abdomen and pelvis was performed using the standard protocol following bolus administration of intravenous contrast. RADIATION DOSE REDUCTION: This exam was performed according to the departmental dose-optimization program which includes automated exposure control, adjustment of the mA and/or kV according to  patient size and/or use of iterative reconstruction technique. CONTRAST:  OMNIPAQUE  IOHEXOL  300 MG/ML  SOLN COMPARISON:  Ultrasound 07/30/2023 FINDINGS: Lower chest: Ground-glass opacities along the elevated right hemidiaphragm are favored due to chronic scarring. Atypical infection not excluded. Hepatobiliary: Focal fatty infiltration along the falciform ligament. Gallbladder wall thickening. Multiple gallstones in the gallbladder neck. No biliary dilation. Pancreas: Unremarkable. No pancreatic ductal dilatation or surrounding inflammatory changes. Spleen: Unremarkable. Adrenals/Urinary Tract: Normal adrenal glands and kidneys. No urinary calculi or hydronephrosis. Unremarkable bladder. Stomach/Bowel: Normal caliber large and small bowel without bowel wall thickening. Stomach and appendix are within normal limits. Vascular/Lymphatic: No significant vascular findings are present. No enlarged abdominal or pelvic lymph nodes. Reproductive: Uterus and bilateral adnexa are unremarkable. Other: No free intraperitoneal fluid or air. Musculoskeletal: No acute fracture. IMPRESSION: 1. Cholelithiasis with gallbladder wall thickening. Findings are concerning for acute cholecystitis. Electronically Signed   By: Norman Gatlin M.D.   On: 06/04/2024 23:33   DG Chest 2 View Result Date: 06/04/2024 CLINICAL DATA:  Chest pain and ongoing issues with bladder and right upper quadrant pain EXAM: CHEST - 2 VIEW COMPARISON:  08/04/2014 FINDINGS: The heart size and mediastinal contours are within normal limits. Both lungs are clear. The visualized skeletal structures are unremarkable. IMPRESSION: No active cardiopulmonary disease. Electronically Signed   By: Norman Gatlin M.D.   On: 06/04/2024 19:40    Anti-infectives: Anti-infectives (From admission, onward)    Start     Dose/Rate Route Frequency Ordered Stop   06/05/24 1300  piperacillin -tazobactam (ZOSYN ) IVPB 3.375 g        3.375 g 12.5 mL/hr over 240 Minutes  Intravenous Every 8 hours 06/05/24 1202 06/12/24 1359   06/05/24 0600  cefTRIAXone  (ROCEPHIN ) 2 g in sodium chloride  0.9 % 100 mL IVPB  Status:  Discontinued       Note to Pharmacy: Pharmacy may adjust dosing strength / duration / interval for maximal efficacy   2 g 200 mL/hr over 30 Minutes Intravenous Every 24 hours 06/05/24 0029 06/05/24 1214   06/05/24 0015  piperacillin -tazobactam (ZOSYN ) IVPB 3.375 g        3.375 g 100 mL/hr over 30 Minutes Intravenous  Once 06/05/24 0011 06/05/24 0052       Assessment/Plan: s/p Procedure(s): LAPAROSCOPIC CHOLECYSTECTOMY (N/A) Advance diet POD 1 lap chole Ambulate Plan for d/c later today  LOS: 1 day    Deward Null III 06/06/2024

## 2024-06-06 NOTE — Progress Notes (Signed)
Reviewed written d/c instructions w pt and all questions answered. She verbalized understanding. D/C via w/c w all belongings in stable condition. 

## 2024-06-06 NOTE — Plan of Care (Signed)

## 2024-06-08 LAB — SURGICAL PATHOLOGY

## 2024-06-08 NOTE — Discharge Summary (Signed)
 [facility] Discharge Summary   Patient ID: Beverly Acosta MRN: [id number] DOB/AGE: 1986-11-03 38 y.o.  Admit date: 06/04/2024 Discharge date: 06/06/2024  Admitting Diagnosis: cholecystiits  Discharge Diagnosis Patient Active Problem List   Diagnosis Date Noted   Acute cholecystitis 06/05/2024   Morbid obesity with body mass index (BMI) of 40.0 to 49.9 (HCC) 06/05/2024   Cholecystitis with cholelithiasis 06/05/2024    Consultants None   Imaging: No results found.  Procedures Dr. Deward Null III (06/05/24) - Laparoscopic Cholecystectomy   Hospital Course:  38 y/o F who presented to the ED with abdominal pain.  Workup showed cholecysitits.  Patient was admitted and underwent procedure listed above.  Tolerated procedure well and was transferred to the floor.  Diet was advanced as tolerated.  On POD#1, the patient was voiding well, tolerating diet, ambulating well, pain well controlled, vital signs stable, incisions c/d/i and felt stable for discharge home.  Patient will follow up in our office in 3-4 weeks and knows to call with questions or concerns. She will call to confirm appointment date/time.    I did not personally evaluate this patient on the day of discharge, Dr. Null did, therefore the above information was obtained entirely from chart review.    Allergies as of 06/06/2024       Reactions   Sulfamethoxazole-trimethoprim Swelling        Medication List     TAKE these medications    amoxicillin -clavulanate 875-125 MG tablet Commonly known as: AUGMENTIN  Take 1 tablet by mouth 2 (two) times daily.   calcium carbonate 500 MG chewable tablet Commonly known as: TUMS - dosed in mg elemental calcium Chew 1 tablet by mouth daily as needed for indigestion or heartburn.   ondansetron  4 MG disintegrating tablet Commonly known as: Zofran  ODT Take 1 tablet (4 mg total) by mouth every 8 (eight) hours as needed for nausea or vomiting.   oxyCODONE  5 MG  immediate release tablet Commonly known as: Oxy IR/ROXICODONE  Take 1 tablet (5 mg total) by mouth every 6 (six) hours as needed for moderate pain (pain score 4-6).          Follow-up Information     Null Deward III, MD Follow up in 2 week(s).   Specialty: General Surgery Contact information: [address] [place] [address] [place] [address] [phone]                 Signed: Almarie Acosta, [facility] 06/08/2024, 2:16 PM
